# Patient Record
Sex: Female | Born: 1945 | Race: White | Hispanic: No | Marital: Single | Smoking: Never smoker
Health system: Southern US, Community
[De-identification: ages and names within clinical notes are randomized; demographics above are authoritative.]

## PROBLEM LIST (undated history)

## (undated) DIAGNOSIS — Z9889 Other specified postprocedural states: Secondary | ICD-10-CM

## (undated) DIAGNOSIS — F329 Major depressive disorder, single episode, unspecified: Secondary | ICD-10-CM

## (undated) DIAGNOSIS — F32A Depression, unspecified: Secondary | ICD-10-CM

## (undated) DIAGNOSIS — R112 Nausea with vomiting, unspecified: Secondary | ICD-10-CM

## (undated) DIAGNOSIS — F0390 Unspecified dementia without behavioral disturbance: Secondary | ICD-10-CM

## (undated) DIAGNOSIS — E119 Type 2 diabetes mellitus without complications: Secondary | ICD-10-CM

## (undated) DIAGNOSIS — E785 Hyperlipidemia, unspecified: Secondary | ICD-10-CM

## (undated) DIAGNOSIS — F419 Anxiety disorder, unspecified: Secondary | ICD-10-CM

## (undated) DIAGNOSIS — I10 Essential (primary) hypertension: Secondary | ICD-10-CM

## (undated) DIAGNOSIS — C801 Malignant (primary) neoplasm, unspecified: Secondary | ICD-10-CM

## (undated) DIAGNOSIS — G4730 Sleep apnea, unspecified: Secondary | ICD-10-CM

## (undated) DIAGNOSIS — M1990 Unspecified osteoarthritis, unspecified site: Secondary | ICD-10-CM

## (undated) HISTORY — PX: BREAST LUMPECTOMY: SHX2

## (undated) HISTORY — PX: CERVICAL POLYPECTOMY: SHX88

## (undated) HISTORY — PX: BREAST SURGERY: SHX581

## (undated) HISTORY — PX: APPENDECTOMY: SHX54

## (undated) HISTORY — PX: HERNIA REPAIR: SHX51

---

## 1983-11-05 HISTORY — PX: LUMBAR LAMINECTOMY: SHX95

## 2012-08-21 ENCOUNTER — Other Ambulatory Visit: Payer: Self-pay | Admitting: Orthopedic Surgery

## 2012-08-21 ENCOUNTER — Encounter (HOSPITAL_BASED_OUTPATIENT_CLINIC_OR_DEPARTMENT_OTHER): Payer: Self-pay | Admitting: *Deleted

## 2012-08-21 NOTE — Progress Notes (Signed)
[facility]-sees np allen claggett
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
## 2012-08-25 ENCOUNTER — Encounter (HOSPITAL_BASED_OUTPATIENT_CLINIC_OR_DEPARTMENT_OTHER): Payer: Self-pay | Admitting: Orthopedic Surgery

## 2012-08-25 ENCOUNTER — Ambulatory Visit (HOSPITAL_BASED_OUTPATIENT_CLINIC_OR_DEPARTMENT_OTHER): Payer: BC Managed Care – PPO | Admitting: Anesthesiology

## 2012-08-25 ENCOUNTER — Encounter (HOSPITAL_BASED_OUTPATIENT_CLINIC_OR_DEPARTMENT_OTHER)
Admission: RE | Disposition: A | Discharge status: Home / Self Care | Payer: Self-pay | Source: Ambulatory Visit | Attending: Orthopedic Surgery

## 2012-08-25 ENCOUNTER — Ambulatory Visit (HOSPITAL_BASED_OUTPATIENT_CLINIC_OR_DEPARTMENT_OTHER)
Admission: RE | Admit: 2012-08-25 | Discharge: 2012-08-25 | Disposition: A | Discharge status: Home / Self Care | Payer: BC Managed Care – PPO | Source: Ambulatory Visit | Attending: Orthopedic Surgery | Admitting: Orthopedic Surgery

## 2012-08-25 ENCOUNTER — Encounter (HOSPITAL_BASED_OUTPATIENT_CLINIC_OR_DEPARTMENT_OTHER): Payer: Self-pay | Admitting: Anesthesiology

## 2012-08-25 DIAGNOSIS — IMO0002 Reserved for concepts with insufficient information to code with codable children: Secondary | ICD-10-CM | POA: Insufficient documentation

## 2012-08-25 DIAGNOSIS — S65509A Unspecified injury of blood vessel of unspecified finger, initial encounter: Secondary | ICD-10-CM | POA: Insufficient documentation

## 2012-08-25 DIAGNOSIS — E119 Type 2 diabetes mellitus without complications: Secondary | ICD-10-CM | POA: Insufficient documentation

## 2012-08-25 DIAGNOSIS — W320XXA Accidental handgun discharge, initial encounter: Secondary | ICD-10-CM | POA: Insufficient documentation

## 2012-08-25 DIAGNOSIS — I10 Essential (primary) hypertension: Secondary | ICD-10-CM | POA: Insufficient documentation

## 2012-08-25 HISTORY — DX: Malignant (primary) neoplasm, unspecified: C80.1

## 2012-08-25 HISTORY — DX: Major depressive disorder, single episode, unspecified: F32.9

## 2012-08-25 HISTORY — DX: Unspecified osteoarthritis, unspecified site: M19.90

## 2012-08-25 HISTORY — PX: ARTERY EXPLORATION: SHX5110

## 2012-08-25 HISTORY — DX: Sleep apnea, unspecified: G47.30

## 2012-08-25 HISTORY — DX: Other specified postprocedural states: Z98.890

## 2012-08-25 HISTORY — DX: Type 2 diabetes mellitus without complications: E11.9

## 2012-08-25 HISTORY — PX: NERVE EXPLORATION: SHX2082

## 2012-08-25 HISTORY — DX: Anxiety disorder, unspecified: F41.9

## 2012-08-25 HISTORY — DX: Nausea with vomiting, unspecified: R11.2

## 2012-08-25 HISTORY — DX: Essential (primary) hypertension: I10

## 2012-08-25 HISTORY — DX: Hyperlipidemia, unspecified: E78.5

## 2012-08-25 HISTORY — DX: Depression, unspecified: F32.A

## 2012-08-25 LAB — POCT I-STAT, CHEM 8
BUN: 17 mg/dL (ref 6–23)
Calcium, Ion: 1.17 mmol/L (ref 1.13–1.30)
Hemoglobin: 13.6 g/dL (ref 12.0–15.0)
TCO2: 22 mmol/L (ref 0–100)

## 2012-08-25 LAB — GLUCOSE, CAPILLARY: Glucose-Capillary: 145 mg/dL — ABNORMAL HIGH (ref 70–99)

## 2012-08-25 SURGERY — EXPLORATION, ARTERY
Anesthesia: General | Site: Finger | Laterality: Right | Wound class: Clean

## 2012-08-25 MED ORDER — HYDROMORPHONE HCL PF 1 MG/ML IJ SOLN: 0.2500 mg | INTRAMUSCULAR | Status: DC | PRN

## 2012-08-25 MED ORDER — BUPIVACAINE-EPINEPHRINE PF 0.5-1:200000 % IJ SOLN
INTRAMUSCULAR | Status: DC | PRN
  Administered 2012-08-25: 27 mL

## 2012-08-25 MED ORDER — LACTATED RINGERS IV SOLN
INTRAVENOUS | Status: DC
  Administered 2012-08-25 (×2): via INTRAVENOUS

## 2012-08-25 MED ORDER — ONDANSETRON HCL 4 MG/2ML IJ SOLN: 4.0000 mg | Freq: Once | INTRAMUSCULAR | Status: DC | PRN

## 2012-08-25 MED ORDER — LIDOCAINE HCL (CARDIAC) 20 MG/ML IV SOLN
INTRAVENOUS | Status: DC | PRN
  Administered 2012-08-25: 60 mg via INTRAVENOUS

## 2012-08-25 MED ORDER — SCOPOLAMINE 1 MG/3DAYS TD PT72
MEDICATED_PATCH | TRANSDERMAL | Status: DC | PRN
  Administered 2012-08-25: 1.5 mg via TRANSDERMAL

## 2012-08-25 MED ORDER — CHLORHEXIDINE GLUCONATE 4 % EX LIQD: 60.0000 mL | Freq: Once | CUTANEOUS | Status: DC

## 2012-08-25 MED ORDER — CEPHALEXIN 250 MG PO CAPS: 250.0000 mg | ORAL_CAPSULE | Freq: Four times a day (QID) | ORAL | Status: DC

## 2012-08-25 MED ORDER — OXYCODONE HCL 5 MG PO TABS: 5.0000 mg | ORAL_TABLET | Freq: Once | ORAL | Status: DC | PRN

## 2012-08-25 MED ORDER — CEFAZOLIN SODIUM-DEXTROSE 2-3 GM-% IV SOLR: 2.0000 g | INTRAVENOUS | Status: DC

## 2012-08-25 MED ORDER — FENTANYL CITRATE 0.05 MG/ML IJ SOLN
50.0000 ug | INTRAMUSCULAR | Status: DC | PRN
  Administered 2012-08-25: 100 ug via INTRAVENOUS

## 2012-08-25 MED ORDER — OXYCODONE HCL 5 MG/5ML PO SOLN: 5.0000 mg | Freq: Once | ORAL | Status: DC | PRN

## 2012-08-25 MED ORDER — OXYCODONE-ACETAMINOPHEN 7.5-500 MG PO TABS: 1.0000 | ORAL_TABLET | ORAL | Status: DC | PRN

## 2012-08-25 MED ORDER — ONDANSETRON HCL 4 MG/2ML IJ SOLN
INTRAMUSCULAR | Status: DC | PRN
  Administered 2012-08-25: 4 mg via INTRAVENOUS

## 2012-08-25 MED ORDER — LIDOCAINE HCL (PF) 1 % IJ SOLN
INTRAVENOUS | Status: DC | PRN
  Administered 2012-08-25: 14:00:00

## 2012-08-25 MED ORDER — DEXAMETHASONE SODIUM PHOSPHATE 10 MG/ML IJ SOLN
INTRAMUSCULAR | Status: DC | PRN
  Administered 2012-08-25: 5 mg via INTRAVENOUS

## 2012-08-25 MED ORDER — PROPOFOL 10 MG/ML IV BOLUS
INTRAVENOUS | Status: DC | PRN
  Administered 2012-08-25: 200 mg via INTRAVENOUS

## 2012-08-25 MED ORDER — DEXTROSE 5 % IV SOLN
3.0000 g | INTRAVENOUS | Status: AC
  Administered 2012-08-25: 3 g via INTRAVENOUS

## 2012-08-25 MED ORDER — MIDAZOLAM HCL 2 MG/2ML IJ SOLN
1.0000 mg | INTRAMUSCULAR | Status: DC | PRN
  Administered 2012-08-25: 2 mg via INTRAVENOUS

## 2012-08-25 SURGICAL SUPPLY — 61 items
3.0MM X 60MM CARBON FIBER ROD ×2 IMPLANT
BAG DECANTER FOR FLEXI CONT (MISCELLANEOUS) ×2 IMPLANT
BANDAGE GAUZE ELAST BULKY 4 IN (GAUZE/BANDAGES/DRESSINGS) ×2 IMPLANT
BLADE MINI RND TIP GREEN BEAV (BLADE) IMPLANT
BLADE SURG 15 STRL LF DISP TIS (BLADE) ×1 IMPLANT
BLADE SURG 15 STRL SS (BLADE) ×1
BNDG COHESIVE 3X5 TAN STRL LF (GAUZE/BANDAGES/DRESSINGS) ×2 IMPLANT
BNDG ESMARK 4X9 LF (GAUZE/BANDAGES/DRESSINGS) IMPLANT
CHLORAPREP W/TINT 26ML (MISCELLANEOUS) ×2 IMPLANT
CLOTH BEACON ORANGE TIMEOUT ST (SAFETY) ×2 IMPLANT
CORDS BIPOLAR (ELECTRODE) ×2 IMPLANT
COVER MAYO STAND STRL (DRAPES) ×2 IMPLANT
COVER TABLE BACK 60X90 (DRAPES) ×2 IMPLANT
CUFF TOURNIQUET SINGLE 18IN (TOURNIQUET CUFF) ×2 IMPLANT
DECANTER SPIKE VIAL GLASS SM (MISCELLANEOUS) IMPLANT
DRAPE EXTREMITY T 121X128X90 (DRAPE) ×2 IMPLANT
DRAPE SURG 17X23 STRL (DRAPES) ×2 IMPLANT
DRSG KUZMA FLUFF (GAUZE/BANDAGES/DRESSINGS) IMPLANT
GAUZE XEROFORM 1X8 LF (GAUZE/BANDAGES/DRESSINGS) ×2 IMPLANT
GLOVE BIO SURGEON STRL SZ 6.5 (GLOVE) ×2 IMPLANT
GLOVE BIO SURGEON STRL SZ7.5 (GLOVE) ×2 IMPLANT
GLOVE BIOGEL PI IND STRL 7.0 (GLOVE) ×1 IMPLANT
GLOVE BIOGEL PI IND STRL 8 (GLOVE) ×1 IMPLANT
GLOVE BIOGEL PI INDICATOR 7.0 (GLOVE) ×1
GLOVE BIOGEL PI INDICATOR 8 (GLOVE) ×1
GLOVE SURG ORTHO 8.0 STRL STRW (GLOVE) ×2 IMPLANT
GOWN BRE IMP PREV XXLGXLNG (GOWN DISPOSABLE) ×4 IMPLANT
GOWN PREVENTION PLUS XLARGE (GOWN DISPOSABLE) ×2 IMPLANT
GUIDEWIRE 1.25MM ×4 IMPLANT
GUIDEWIRE 1.6MM ×4 IMPLANT
HOLDING CLAMPS ×4 IMPLANT
LOOP VESSEL MAXI BLUE (MISCELLANEOUS) IMPLANT
NDL SAFETY ECLIPSE 18X1.5 (NEEDLE) ×2 IMPLANT
NEEDLE 27GAX1X1/2 (NEEDLE) IMPLANT
NEEDLE HYPO 18GX1.5 SHARP (NEEDLE) ×2
NS IRRIG 1000ML POUR BTL (IV SOLUTION) ×2 IMPLANT
PACK BASIN DAY SURGERY FS (CUSTOM PROCEDURE TRAY) ×2 IMPLANT
PAD CAST 3X4 CTTN HI CHSV (CAST SUPPLIES) ×1 IMPLANT
PAD CAST 4YDX4 CTTN HI CHSV (CAST SUPPLIES) IMPLANT
PADDING CAST ABS 4INX4YD NS (CAST SUPPLIES) ×1
PADDING CAST ABS COTTON 4X4 ST (CAST SUPPLIES) ×1 IMPLANT
PADDING CAST COTTON 3X4 STRL (CAST SUPPLIES) ×1
PADDING CAST COTTON 4X4 STRL (CAST SUPPLIES)
SLEEVE SCD COMPRESS KNEE MED (MISCELLANEOUS) ×2 IMPLANT
SPEAR EYE SURG WECK-CEL (MISCELLANEOUS) ×2 IMPLANT
SPLINT PLASTER CAST XFAST 3X15 (CAST SUPPLIES) IMPLANT
SPLINT PLASTER XTRA FASTSET 3X (CAST SUPPLIES)
SPONGE GAUZE 4X4 12PLY (GAUZE/BANDAGES/DRESSINGS) ×2 IMPLANT
STOCKINETTE 4X48 STRL (DRAPES) ×2 IMPLANT
SUT ETHIBOND 3-0 V-5 (SUTURE) IMPLANT
SUT ETHILON 9 0 V 100.4 (SUTURE) ×2 IMPLANT
SUT MERSILENE 6 0 P 1 (SUTURE) IMPLANT
SUT NYLON 9 0 VRM6 (SUTURE) ×2 IMPLANT
SUT SILK 4 0 PS 2 (SUTURE) IMPLANT
SUT VICRYL 4-0 PS2 18IN ABS (SUTURE) IMPLANT
SUT VICRYL RAPIDE 4/0 PS 2 (SUTURE) ×2 IMPLANT
SYR BULB 3OZ (MISCELLANEOUS) ×2 IMPLANT
SYR CONTROL 10ML LL (SYRINGE) ×4 IMPLANT
TOWEL OR 17X24 6PK STRL BLUE (TOWEL DISPOSABLE) ×2 IMPLANT
UNDERPAD 30X30 INCONTINENT (UNDERPADS AND DIAPERS) ×2 IMPLANT
WATER STERILE IRR 1000ML POUR (IV SOLUTION) IMPLANT

## 2012-08-25 NOTE — Op Note (Signed)
Dictated [id number]

## 2012-08-25 NOTE — Anesthesia Preprocedure Evaluation (Addendum)
Anesthesia Evaluation  Patient identified by MRN, date of birth, ID band Patient awake    Reviewed: Allergy & Precautions, H&P , NPO status , Patient's Chart, lab work & pertinent test results  History of Anesthesia Complications (+) PONV  Airway Mallampati: I TM Distance: >3 FB     Dental  (+) Teeth Intact and Dental Advisory Given   Pulmonary sleep apnea and Continuous Positive Airway Pressure Ventilation ,  breath sounds clear to auscultation        Cardiovascular hypertension, Pt. on medications Rhythm:Regular Rate:Normal     Neuro/Psych    GI/Hepatic   Endo/Other  diabetes, Well Controlled, Type 2, Oral Hypoglycemic AgentsMorbid obesity  Renal/GU      Musculoskeletal   Abdominal (+) + obese,   Peds  Hematology   Anesthesia Other Findings   Reproductive/Obstetrics                          Anesthesia Physical Anesthesia Plan  ASA: III  Anesthesia Plan: General   Post-op Pain Management:    Induction: Intravenous  Airway Management Planned: LMA  Additional Equipment:   Intra-op Plan:   Post-operative Plan: Extubation in OR  Informed Consent: I have reviewed the patients History and Physical, chart, labs and discussed the procedure including the risks, benefits and alternatives for the proposed anesthesia with the patient or authorized representative who has indicated his/her understanding and acceptance.   Dental advisory given  Plan Discussed with: CRNA, Anesthesiologist and Surgeon  Anesthesia Plan Comments:         Anesthesia Quick Evaluation
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
## 2012-08-25 NOTE — Anesthesia Postprocedure Evaluation (Signed)
  Anesthesia Post-op Note  Patient: Brandi [name]  Procedure(s) Performed: Procedure(s) (LRB) with comments: ARTERY EXPLORATION (Right) - Application External Fixator Exploration Digital Artery Right Little Finger/Exploration Artery and Nerve Right Ring Finger NERVE EXPLORATION (Right)  Patient Location: PACU  Anesthesia Type: GA combined with regional for post-op pain  Level of Consciousness: awake, alert  and oriented  Airway and Oxygen Therapy: Patient Spontanous Breathing and Patient connected to face mask oxygen  Post-op Pain: none  Post-op Assessment: Post-op Vital signs reviewed  Post-op Vital Signs: Reviewed  Complications: No apparent anesthesia complications

## 2012-08-25 NOTE — Transfer of Care (Signed)
Immediate Anesthesia Transfer of Care Note  Patient: Brandi [name]  Procedure(s) Performed: Procedure(s) (LRB) with comments: ARTERY EXPLORATION (Right) - Application External Fixator Exploration Digital Artery Right Little Finger/Exploration Artery and Nerve Right Ring Finger NERVE EXPLORATION (Right)  Patient Location: PACU  Anesthesia Type: GA combined with regional for post-op pain  Level of Consciousness: sedated  Airway & Oxygen Therapy: Patient Spontanous Breathing and Patient connected to face mask oxygen  Post-op Assessment: Report given to PACU RN and Post -op Vital signs reviewed and stable  Post vital signs: Reviewed and stable  Complications: No apparent anesthesia complications

## 2012-08-25 NOTE — Progress Notes (Signed)
Assisted Dr. Crews with right, ultrasound guided, supraclavicular block. Side rails up, monitors on throughout procedure. See vital signs in flow sheet. Tolerated Procedure well. 
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
## 2012-08-31 ENCOUNTER — Encounter (HOSPITAL_BASED_OUTPATIENT_CLINIC_OR_DEPARTMENT_OTHER): Payer: Self-pay

## 2012-09-03 ENCOUNTER — Encounter (HOSPITAL_BASED_OUTPATIENT_CLINIC_OR_DEPARTMENT_OTHER): Payer: Self-pay | Admitting: Orthopedic Surgery

## 2012-09-29 ENCOUNTER — Other Ambulatory Visit: Payer: Self-pay | Admitting: Orthopedic Surgery

## 2012-10-06 ENCOUNTER — Encounter (HOSPITAL_BASED_OUTPATIENT_CLINIC_OR_DEPARTMENT_OTHER): Payer: Self-pay | Admitting: *Deleted

## 2012-10-06 NOTE — Progress Notes (Signed)
Pt here 1//13 for this finger-no chg since last visit

## 2012-10-07 ENCOUNTER — Encounter (HOSPITAL_BASED_OUTPATIENT_CLINIC_OR_DEPARTMENT_OTHER): Payer: Self-pay

## 2012-10-07 ENCOUNTER — Ambulatory Visit (HOSPITAL_BASED_OUTPATIENT_CLINIC_OR_DEPARTMENT_OTHER): Payer: BC Managed Care – PPO | Admitting: Anesthesiology

## 2012-10-07 ENCOUNTER — Encounter (HOSPITAL_BASED_OUTPATIENT_CLINIC_OR_DEPARTMENT_OTHER)
Admission: RE | Disposition: A | Discharge status: Home / Self Care | Payer: Self-pay | Source: Ambulatory Visit | Attending: Orthopedic Surgery

## 2012-10-07 ENCOUNTER — Encounter (HOSPITAL_BASED_OUTPATIENT_CLINIC_OR_DEPARTMENT_OTHER): Payer: Self-pay | Admitting: Anesthesiology

## 2012-10-07 ENCOUNTER — Ambulatory Visit (HOSPITAL_BASED_OUTPATIENT_CLINIC_OR_DEPARTMENT_OTHER)
Admission: RE | Admit: 2012-10-07 | Discharge: 2012-10-07 | Disposition: A | Discharge status: Home / Self Care | Payer: BC Managed Care – PPO | Source: Ambulatory Visit | Attending: Orthopedic Surgery | Admitting: Orthopedic Surgery

## 2012-10-07 DIAGNOSIS — Z853 Personal history of malignant neoplasm of breast: Secondary | ICD-10-CM | POA: Insufficient documentation

## 2012-10-07 DIAGNOSIS — Z4789 Encounter for other orthopedic aftercare: Secondary | ICD-10-CM | POA: Insufficient documentation

## 2012-10-07 DIAGNOSIS — E119 Type 2 diabetes mellitus without complications: Secondary | ICD-10-CM | POA: Insufficient documentation

## 2012-10-07 DIAGNOSIS — IMO0002 Reserved for concepts with insufficient information to code with codable children: Secondary | ICD-10-CM | POA: Insufficient documentation

## 2012-10-07 DIAGNOSIS — W320XXA Accidental handgun discharge, initial encounter: Secondary | ICD-10-CM | POA: Insufficient documentation

## 2012-10-07 DIAGNOSIS — I10 Essential (primary) hypertension: Secondary | ICD-10-CM | POA: Insufficient documentation

## 2012-10-07 HISTORY — PX: PROXIMAL INTERPHALANGEAL FUSION (PIP): SHX6043

## 2012-10-07 HISTORY — PX: EXTERNAL FIXATION REMOVAL: SHX5040

## 2012-10-07 LAB — POCT I-STAT, CHEM 8
BUN: 19 mg/dL (ref 6–23)
Calcium, Ion: 1.1 mmol/L — ABNORMAL LOW (ref 1.13–1.30)
Glucose, Bld: 127 mg/dL — ABNORMAL HIGH (ref 70–99)
TCO2: 26 mmol/L (ref 0–100)

## 2012-10-07 SURGERY — FUSION, PIP JOINT
Anesthesia: Regional | Site: Hand | Laterality: Right | Wound class: Clean

## 2012-10-07 MED ORDER — DEXAMETHASONE SODIUM PHOSPHATE 10 MG/ML IJ SOLN
INTRAMUSCULAR | Status: DC | PRN
  Administered 2012-10-07: 4 mg via INTRAVENOUS

## 2012-10-07 MED ORDER — CHLORHEXIDINE GLUCONATE 4 % EX LIQD: 60.0000 mL | Freq: Once | CUTANEOUS | Status: DC

## 2012-10-07 MED ORDER — FENTANYL CITRATE 0.05 MG/ML IJ SOLN
100.0000 ug | Freq: Once | INTRAMUSCULAR | Status: AC
  Administered 2012-10-07: 100 ug via INTRAVENOUS

## 2012-10-07 MED ORDER — ONDANSETRON HCL 4 MG/2ML IJ SOLN
INTRAMUSCULAR | Status: DC | PRN
  Administered 2012-10-07: 4 mg via INTRAVENOUS

## 2012-10-07 MED ORDER — OXYCODONE HCL 5 MG/5ML PO SOLN: 5.0000 mg | Freq: Once | ORAL | Status: DC | PRN

## 2012-10-07 MED ORDER — OXYCODONE-ACETAMINOPHEN 7.5-325 MG PO TABS: 1.0000 | ORAL_TABLET | ORAL | Status: DC | PRN

## 2012-10-07 MED ORDER — HYDROMORPHONE HCL PF 1 MG/ML IJ SOLN: 0.2500 mg | INTRAMUSCULAR | Status: DC | PRN

## 2012-10-07 MED ORDER — PROPOFOL 10 MG/ML IV BOLUS
INTRAVENOUS | Status: DC | PRN
  Administered 2012-10-07: 200 mg via INTRAVENOUS

## 2012-10-07 MED ORDER — FENTANYL CITRATE 0.05 MG/ML IJ SOLN
INTRAMUSCULAR | Status: DC | PRN
  Administered 2012-10-07: 50 ug via INTRAVENOUS

## 2012-10-07 MED ORDER — OXYCODONE HCL 5 MG PO TABS: 5.0000 mg | ORAL_TABLET | Freq: Once | ORAL | Status: DC | PRN

## 2012-10-07 MED ORDER — MIDAZOLAM HCL 2 MG/2ML IJ SOLN
1.0000 mg | INTRAMUSCULAR | Status: DC | PRN
  Administered 2012-10-07: 2 mg via INTRAVENOUS

## 2012-10-07 MED ORDER — SCOPOLAMINE 1 MG/3DAYS TD PT72
1.0000 | MEDICATED_PATCH | Freq: Once | TRANSDERMAL | Status: DC
  Administered 2012-10-07: 1.5 mg via TRANSDERMAL

## 2012-10-07 MED ORDER — ROPIVACAINE HCL 5 MG/ML IJ SOLN
INTRAMUSCULAR | Status: DC | PRN
  Administered 2012-10-07: 30 mL

## 2012-10-07 MED ORDER — CEFAZOLIN SODIUM-DEXTROSE 2-3 GM-% IV SOLR: 2.0000 g | INTRAVENOUS | Status: DC

## 2012-10-07 MED ORDER — LIDOCAINE HCL (CARDIAC) 20 MG/ML IV SOLN
INTRAVENOUS | Status: DC | PRN
  Administered 2012-10-07: 50 mg via INTRAVENOUS

## 2012-10-07 MED ORDER — CEPHALEXIN 500 MG PO CAPS: 500.0000 mg | ORAL_CAPSULE | Freq: Four times a day (QID) | ORAL | Status: DC

## 2012-10-07 MED ORDER — DEXTROSE 5 % IV SOLN
3.0000 g | INTRAVENOUS | Status: AC
  Administered 2012-10-07: 3 g via INTRAVENOUS

## 2012-10-07 MED ORDER — PROMETHAZINE HCL 25 MG/ML IJ SOLN: 6.2500 mg | INTRAMUSCULAR | Status: DC | PRN

## 2012-10-07 MED ORDER — MEPERIDINE HCL 25 MG/ML IJ SOLN: 6.2500 mg | INTRAMUSCULAR | Status: DC | PRN

## 2012-10-07 MED ORDER — LACTATED RINGERS IV SOLN: INTRAVENOUS | Status: DC

## 2012-10-07 MED ORDER — LACTATED RINGERS IV SOLN
INTRAVENOUS | Status: DC
  Administered 2012-10-07 (×2): via INTRAVENOUS

## 2012-10-07 SURGICAL SUPPLY — 60 items
BANDAGE GAUZE ELAST BULKY 4 IN (GAUZE/BANDAGES/DRESSINGS) ×3 IMPLANT
BIT DRILL 1.0 (BIT) ×3
BIT DRILL 1.0X50 (BIT) ×2 IMPLANT
BLADE MINI RND TIP GREEN BEAV (BLADE) ×3 IMPLANT
BLADE SURG 15 STRL LF DISP TIS (BLADE) ×4 IMPLANT
BLADE SURG 15 STRL SS (BLADE) ×2
BNDG COHESIVE 3X5 TAN STRL LF (GAUZE/BANDAGES/DRESSINGS) ×3 IMPLANT
BNDG ESMARK 4X9 LF (GAUZE/BANDAGES/DRESSINGS) ×3 IMPLANT
BUR FAST CUTTING MED (BURR) IMPLANT
CHLORAPREP W/TINT 26ML (MISCELLANEOUS) ×3 IMPLANT
CLOTH BEACON ORANGE TIMEOUT ST (SAFETY) ×3 IMPLANT
CORDS BIPOLAR (ELECTRODE) ×3 IMPLANT
COVER MAYO STAND STRL (DRAPES) ×3 IMPLANT
COVER TABLE BACK 60X90 (DRAPES) ×3 IMPLANT
CUFF TOURNIQUET SINGLE 18IN (TOURNIQUET CUFF) ×3 IMPLANT
DRAPE EXTREMITY T 121X128X90 (DRAPE) ×3 IMPLANT
DRAPE OEC MINIVIEW 54X84 (DRAPES) ×3 IMPLANT
DRAPE SURG 17X23 STRL (DRAPES) ×3 IMPLANT
DRSG KUZMA FLUFF (GAUZE/BANDAGES/DRESSINGS) IMPLANT
GAUZE XEROFORM 1X8 LF (GAUZE/BANDAGES/DRESSINGS) ×3 IMPLANT
GLOVE BIO SURGEON STRL SZ 6.5 (GLOVE) ×3 IMPLANT
GLOVE BIO SURGEON STRL SZ8.5 (GLOVE) ×3 IMPLANT
GLOVE BIOGEL PI IND STRL 7.0 (GLOVE) ×2 IMPLANT
GLOVE BIOGEL PI IND STRL 8 (GLOVE) ×2 IMPLANT
GLOVE BIOGEL PI IND STRL 8.5 (GLOVE) ×2 IMPLANT
GLOVE BIOGEL PI INDICATOR 7.0 (GLOVE) ×1
GLOVE BIOGEL PI INDICATOR 8 (GLOVE) ×1
GLOVE BIOGEL PI INDICATOR 8.5 (GLOVE) ×1
GLOVE SKINSENSE NS SZ7.5 (GLOVE) ×1
GLOVE SKINSENSE STRL SZ7.5 (GLOVE) ×2 IMPLANT
GLOVE SURG ORTHO 8.0 STRL STRW (GLOVE) ×3 IMPLANT
GOWN BRE IMP PREV XXLGXLNG (GOWN DISPOSABLE) ×6 IMPLANT
GOWN PREVENTION PLUS XLARGE (GOWN DISPOSABLE) ×3 IMPLANT
K-WIRE .035X4 (WIRE) ×3 IMPLANT
NS IRRIG 1000ML POUR BTL (IV SOLUTION) ×3 IMPLANT
PACK BASIN DAY SURGERY FS (CUSTOM PROCEDURE TRAY) ×3 IMPLANT
PAD CAST 3X4 CTTN HI CHSV (CAST SUPPLIES) ×2 IMPLANT
PADDING CAST ABS 3INX4YD NS (CAST SUPPLIES)
PADDING CAST ABS 4INX4YD NS (CAST SUPPLIES) ×1
PADDING CAST ABS COTTON 3X4 (CAST SUPPLIES) IMPLANT
PADDING CAST ABS COTTON 4X4 ST (CAST SUPPLIES) ×2 IMPLANT
PADDING CAST COTTON 3X4 STRL (CAST SUPPLIES) ×1
PLATE STRAIGHT 1.3 6 HOLE (Plate) ×3 IMPLANT
SCREW SELF TAP CORTEX 1.3 10MM (Screw) ×3 IMPLANT
SCREW SELF TAP CORTEX 1.3 6MM (Screw) ×6 IMPLANT
SCREW SELF TAP CORTEX 1.3 8MM (Screw) ×3 IMPLANT
SCREW SELF TAP CORTEX 1.3 9MM (Screw) ×3 IMPLANT
SLEEVE SCD COMPRESS KNEE MED (MISCELLANEOUS) ×3 IMPLANT
SPLINT PLASTER CAST XFAST 3X15 (CAST SUPPLIES) ×40 IMPLANT
SPLINT PLASTER XTRA FASTSET 3X (CAST SUPPLIES) ×20
SPONGE GAUZE 4X4 12PLY (GAUZE/BANDAGES/DRESSINGS) ×3 IMPLANT
STOCKINETTE 4X48 STRL (DRAPES) ×3 IMPLANT
SUT VICRYL 4-0 PS2 18IN ABS (SUTURE) ×6 IMPLANT
SUT VICRYL RAPID 5 0 P 3 (SUTURE) IMPLANT
SUT VICRYL RAPIDE 4/0 PS 2 (SUTURE) ×3 IMPLANT
SYR BULB 3OZ (MISCELLANEOUS) ×3 IMPLANT
SYR CONTROL 10ML LL (SYRINGE) IMPLANT
TOWEL OR 17X24 6PK STRL BLUE (TOWEL DISPOSABLE) ×3 IMPLANT
UNDERPAD 30X30 INCONTINENT (UNDERPADS AND DIAPERS) ×3 IMPLANT
WATER STERILE IRR 1000ML POUR (IV SOLUTION) ×3 IMPLANT

## 2012-10-07 NOTE — Transfer of Care (Signed)
Immediate Anesthesia Transfer of Care Note  Patient: Brandi [name]  Procedure(s) Performed: Procedure(s) (LRB) with comments: PROXIMAL INTERPHALANGEAL FUSION (PIP) (Right) - FUSION PIP RIGHT SMALL FINGER, DISTAL RADIUS GRAFT, REMOVAL OF EXTERNAL FIXATOR, SYNTHESE MINI REMOVAL EXTERNAL FIXATION ARM (Right)  Patient Location: PACU  Anesthesia Type:GA combined with regional for post-op pain  Level of Consciousness: awake, alert  and oriented  Airway & Oxygen Therapy: Patient Spontanous Breathing and Patient connected to face mask oxygen  Post-op Assessment: Report given to PACU RN, Post -op Vital signs reviewed and stable and Patient moving all extremities  Post vital signs: Reviewed and stable  Complications: No apparent anesthesia complications

## 2012-10-07 NOTE — Anesthesia Postprocedure Evaluation (Signed)
  Anesthesia Post-op Note  Patient: Brandi [name]  Procedure(s) Performed: Procedure(s) (LRB) with comments: PROXIMAL INTERPHALANGEAL FUSION (PIP) (Right) - FUSION PIP RIGHT SMALL FINGER, DISTAL RADIUS GRAFT, REMOVAL OF EXTERNAL FIXATOR, SYNTHESE MINI REMOVAL EXTERNAL FIXATION ARM (Right)  Patient Location: PACU  Anesthesia Type:GA combined with regional for post-op pain  Level of Consciousness: awake, alert  and oriented  Airway and Oxygen Therapy: Patient Spontanous Breathing and Patient connected to face mask oxygen  Post-op Pain: none  Post-op Assessment: Post-op Vital signs reviewed, Patient's Cardiovascular Status Stable, Respiratory Function Stable, Patent Airway and No signs of Nausea or vomiting  Post-op Vital Signs: Reviewed and stable  Complications: No apparent anesthesia complications
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
## 2012-10-07 NOTE — Progress Notes (Signed)
Assisted Dr. Massagee with right, ultrasound guided, supraclavicular block. Side rails up, monitors on throughout procedure. See vital signs in flow sheet. Tolerated Procedure well. 

## 2012-10-07 NOTE — Anesthesia Preprocedure Evaluation (Addendum)
Anesthesia Evaluation  Patient identified by MRN, date of birth, ID band Patient awake    Reviewed: Allergy & Precautions, H&P , NPO status , Patient's Chart, lab work & pertinent test results  Airway Mallampati: I TM Distance: >3 FB Neck ROM: full    Dental  (+) Teeth Intact and Dental Advisory Given   Pulmonary sleep apnea and Continuous Positive Airway Pressure Ventilation ,  breath sounds clear to auscultation        Cardiovascular hypertension, Pt. on medications Rhythm:Regular Rate:Normal     Neuro/Psych    GI/Hepatic   Endo/Other  diabetes, Well Controlled, Type 2, Oral Hypoglycemic AgentsMorbid obesity  Renal/GU      Musculoskeletal   Abdominal (+) + obese,   Peds  Hematology   Anesthesia Other Findings   Reproductive/Obstetrics                           Anesthesia Physical Anesthesia Plan  ASA: III  Anesthesia Plan: General LMA and Regional   Post-op Pain Management: MAC Combined w/ Regional for Post-op pain   Induction:   Airway Management Planned:   Additional Equipment:   Intra-op Plan:   Post-operative Plan:   Informed Consent:   Plan Discussed with:   Anesthesia Plan Comments:         Anesthesia Quick Evaluation

## 2012-10-07 NOTE — Op Note (Signed)
Other Dictation: Dictation Number [id number]

## 2012-10-07 NOTE — Anesthesia Procedure Notes (Addendum)
Anesthesia Regional Block:  Supraclavicular block  Pre-Anesthetic Checklist: ,, timeout performed, Correct Patient, Correct Site, Correct Laterality, Correct Procedure, Correct Position, site marked, Risks and benefits discussed,  Surgical consent,  Pre-op evaluation,  At surgeon's request and post-op pain management  Laterality: Right and Upper  Prep: chloraprep       Needles:  Injection technique: Single-shot  Needle Type: Echogenic Needle      Needle Gauge: 22 and 22 G    Additional Needles:  Procedures: ultrasound guided (picture in chart) Supraclavicular block Narrative:  Start time: 10/07/2012 12:55 PM End time: 10/07/2012 1:14 PM Injection made incrementally with aspirations every 5 mL.  Performed by: Personally  Anesthesiologist: TMassagee  Supraclavicular block Procedure Name: LMA Insertion Date/Time: 10/07/2012 1:28 PM Performed by: Meyer Russel Pre-anesthesia Checklist: Patient identified, Emergency Drugs available, Suction available and Patient being monitored Patient Re-evaluated:Patient Re-evaluated prior to inductionOxygen Delivery Method: Circle System Utilized Preoxygenation: Pre-oxygenation with 100% oxygen Intubation Type: IV induction Ventilation: Mask ventilation without difficulty LMA: LMA inserted LMA Size: 4.0 Number of attempts: 1 Airway Equipment and Method: bite block Placement Confirmation: positive ETCO2 and breath sounds checked- equal and bilateral Tube secured with: Tape Dental Injury: Teeth and Oropharynx as per pre-operative assessment
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
## 2012-10-08 ENCOUNTER — Encounter (HOSPITAL_BASED_OUTPATIENT_CLINIC_OR_DEPARTMENT_OTHER): Payer: Self-pay | Admitting: Orthopedic Surgery
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
## 2012-10-09 LAB — WOUND CULTURE

## 2012-10-12 LAB — ANAEROBIC CULTURE

## 2016-06-24 ENCOUNTER — Other Ambulatory Visit: Payer: Self-pay | Admitting: Orthopedic Surgery

## 2016-06-26 ENCOUNTER — Encounter (HOSPITAL_BASED_OUTPATIENT_CLINIC_OR_DEPARTMENT_OTHER): Payer: Self-pay | Admitting: *Deleted
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
## 2016-06-28 ENCOUNTER — Encounter (HOSPITAL_BASED_OUTPATIENT_CLINIC_OR_DEPARTMENT_OTHER)
Admission: RE | Admit: 2016-06-28 | Discharge: 2016-06-28 | Disposition: A | Discharge status: Home / Self Care | Payer: Medicare Other | Source: Ambulatory Visit | Attending: Orthopedic Surgery | Admitting: Orthopedic Surgery

## 2016-06-28 ENCOUNTER — Other Ambulatory Visit: Payer: Self-pay

## 2016-06-28 DIAGNOSIS — Z7984 Long term (current) use of oral hypoglycemic drugs: Secondary | ICD-10-CM | POA: Diagnosis not present

## 2016-06-28 DIAGNOSIS — E785 Hyperlipidemia, unspecified: Secondary | ICD-10-CM | POA: Diagnosis not present

## 2016-06-28 DIAGNOSIS — I10 Essential (primary) hypertension: Secondary | ICD-10-CM | POA: Diagnosis not present

## 2016-06-28 DIAGNOSIS — F329 Major depressive disorder, single episode, unspecified: Secondary | ICD-10-CM | POA: Diagnosis not present

## 2016-06-28 DIAGNOSIS — S42292A Other displaced fracture of upper end of left humerus, initial encounter for closed fracture: Secondary | ICD-10-CM | POA: Diagnosis not present

## 2016-06-28 DIAGNOSIS — E119 Type 2 diabetes mellitus without complications: Secondary | ICD-10-CM | POA: Diagnosis not present

## 2016-06-28 DIAGNOSIS — Z6841 Body Mass Index (BMI) 40.0 and over, adult: Secondary | ICD-10-CM | POA: Diagnosis not present

## 2016-06-28 DIAGNOSIS — G4730 Sleep apnea, unspecified: Secondary | ICD-10-CM | POA: Diagnosis not present

## 2016-06-28 DIAGNOSIS — Z853 Personal history of malignant neoplasm of breast: Secondary | ICD-10-CM | POA: Diagnosis not present

## 2016-06-28 DIAGNOSIS — Z79899 Other long term (current) drug therapy: Secondary | ICD-10-CM | POA: Diagnosis not present

## 2016-06-28 DIAGNOSIS — W1830XA Fall on same level, unspecified, initial encounter: Secondary | ICD-10-CM | POA: Diagnosis not present

## 2016-06-28 LAB — BASIC METABOLIC PANEL
Anion gap: 12 (ref 5–15)
BUN: 17 mg/dL (ref 6–20)
CHLORIDE: 99 mmol/L — AB (ref 101–111)
CO2: 24 mmol/L (ref 22–32)
CREATININE: 1.04 mg/dL — AB (ref 0.44–1.00)
Calcium: 9.3 mg/dL (ref 8.9–10.3)
GFR calc Af Amer: >60 mL/min (ref >60)
GFR, EST NON AFRICAN AMERICAN: 54 mL/min — AB (ref >60)
GLUCOSE: 215 mg/dL — AB (ref 65–99)
POTASSIUM: 4.2 mmol/L (ref 3.5–5.1)
SODIUM: 135 mmol/L (ref 135–145)

## 2016-06-28 NOTE — Progress Notes (Signed)
Dr. Ola Spurr examined pt's airway ( Anesthesia Consult) - [place] for surgery. Dr. Ola Spurr reviewed Ekg - ok for surgery

## 2016-07-01 ENCOUNTER — Ambulatory Visit (HOSPITAL_BASED_OUTPATIENT_CLINIC_OR_DEPARTMENT_OTHER)
Admission: RE | Admit: 2016-07-01 | Discharge: 2016-07-01 | Disposition: A | Discharge status: Home / Self Care | Payer: Medicare Other | Source: Ambulatory Visit | Attending: Orthopedic Surgery | Admitting: Orthopedic Surgery

## 2016-07-01 ENCOUNTER — Ambulatory Visit (HOSPITAL_BASED_OUTPATIENT_CLINIC_OR_DEPARTMENT_OTHER): Payer: Medicare Other | Admitting: Anesthesiology

## 2016-07-01 ENCOUNTER — Ambulatory Visit (HOSPITAL_COMMUNITY): Payer: Medicare Other

## 2016-07-01 ENCOUNTER — Encounter (HOSPITAL_BASED_OUTPATIENT_CLINIC_OR_DEPARTMENT_OTHER)
Admission: RE | Disposition: A | Discharge status: Home / Self Care | Payer: Self-pay | Source: Ambulatory Visit | Attending: Orthopedic Surgery

## 2016-07-01 ENCOUNTER — Encounter (HOSPITAL_BASED_OUTPATIENT_CLINIC_OR_DEPARTMENT_OTHER): Payer: Self-pay

## 2016-07-01 DIAGNOSIS — I10 Essential (primary) hypertension: Secondary | ICD-10-CM | POA: Insufficient documentation

## 2016-07-01 DIAGNOSIS — G4730 Sleep apnea, unspecified: Secondary | ICD-10-CM | POA: Insufficient documentation

## 2016-07-01 DIAGNOSIS — E119 Type 2 diabetes mellitus without complications: Secondary | ICD-10-CM | POA: Diagnosis not present

## 2016-07-01 DIAGNOSIS — Z853 Personal history of malignant neoplasm of breast: Secondary | ICD-10-CM | POA: Diagnosis not present

## 2016-07-01 DIAGNOSIS — S42292A Other displaced fracture of upper end of left humerus, initial encounter for closed fracture: Secondary | ICD-10-CM | POA: Diagnosis not present

## 2016-07-01 DIAGNOSIS — F329 Major depressive disorder, single episode, unspecified: Secondary | ICD-10-CM | POA: Insufficient documentation

## 2016-07-01 DIAGNOSIS — Z6841 Body Mass Index (BMI) 40.0 and over, adult: Secondary | ICD-10-CM | POA: Insufficient documentation

## 2016-07-01 DIAGNOSIS — W1830XA Fall on same level, unspecified, initial encounter: Secondary | ICD-10-CM | POA: Insufficient documentation

## 2016-07-01 DIAGNOSIS — E785 Hyperlipidemia, unspecified: Secondary | ICD-10-CM | POA: Insufficient documentation

## 2016-07-01 DIAGNOSIS — Z79899 Other long term (current) drug therapy: Secondary | ICD-10-CM | POA: Insufficient documentation

## 2016-07-01 DIAGNOSIS — Z4789 Encounter for other orthopedic aftercare: Secondary | ICD-10-CM

## 2016-07-01 DIAGNOSIS — Z7984 Long term (current) use of oral hypoglycemic drugs: Secondary | ICD-10-CM | POA: Insufficient documentation

## 2016-07-01 HISTORY — PX: ORIF HUMERUS FRACTURE: SHX2126

## 2016-07-01 LAB — GLUCOSE, CAPILLARY
GLUCOSE-CAPILLARY: 210 mg/dL — AB (ref 65–99)
Glucose-Capillary: 108 mg/dL — ABNORMAL HIGH (ref 65–99)

## 2016-07-01 SURGERY — OPEN REDUCTION INTERNAL FIXATION (ORIF) PROXIMAL HUMERUS FRACTURE
Anesthesia: General | Site: Arm Upper | Laterality: Left

## 2016-07-01 MED ORDER — FENTANYL CITRATE (PF) 100 MCG/2ML IJ SOLN
INTRAMUSCULAR | Status: DC | PRN
  Administered 2016-07-01 (×2): 50 ug via INTRAVENOUS

## 2016-07-01 MED ORDER — FENTANYL CITRATE (PF) 100 MCG/2ML IJ SOLN
INTRAMUSCULAR | Status: AC
  Filled 2016-07-01: qty 2

## 2016-07-01 MED ORDER — MIDAZOLAM HCL 2 MG/2ML IJ SOLN
1.0000 mg | INTRAMUSCULAR | Status: DC | PRN
  Administered 2016-07-01: 2 mg via INTRAVENOUS

## 2016-07-01 MED ORDER — GLYCOPYRROLATE 0.2 MG/ML IJ SOLN: 0.2000 mg | Freq: Once | INTRAMUSCULAR | Status: DC | PRN

## 2016-07-01 MED ORDER — LIDOCAINE HCL (CARDIAC) 20 MG/ML IV SOLN
INTRAVENOUS | Status: DC | PRN
  Administered 2016-07-01: 50 mg via INTRAVENOUS
  Administered 2016-07-01: 60 mg via INTRAVENOUS

## 2016-07-01 MED ORDER — FENTANYL CITRATE (PF) 100 MCG/2ML IJ SOLN
50.0000 ug | INTRAMUSCULAR | Status: DC | PRN
  Administered 2016-07-01: 100 ug via INTRAVENOUS

## 2016-07-01 MED ORDER — CEFAZOLIN SODIUM-DEXTROSE 2-4 GM/100ML-% IV SOLN
2.0000 g | INTRAVENOUS | Status: AC
  Administered 2016-07-01: 3 g via INTRAVENOUS

## 2016-07-01 MED ORDER — LACTATED RINGERS IV SOLN
INTRAVENOUS | Status: DC
  Administered 2016-07-01: 09:00:00 via INTRAVENOUS

## 2016-07-01 MED ORDER — SUCCINYLCHOLINE CHLORIDE 20 MG/ML IJ SOLN
INTRAMUSCULAR | Status: DC | PRN
  Administered 2016-07-01: 100 mg via INTRAVENOUS

## 2016-07-01 MED ORDER — OXYCODONE-ACETAMINOPHEN 5-325 MG PO TABS: 1.0000 | ORAL_TABLET | Freq: Four times a day (QID) | ORAL | 0 refills | Status: DC | PRN

## 2016-07-01 MED ORDER — PROPOFOL 10 MG/ML IV BOLUS
INTRAVENOUS | Status: AC
  Filled 2016-07-01: qty 20

## 2016-07-01 MED ORDER — CEFAZOLIN SODIUM-DEXTROSE 2-4 GM/100ML-% IV SOLN
INTRAVENOUS | Status: AC
  Filled 2016-07-01: qty 100

## 2016-07-01 MED ORDER — PHENYLEPHRINE HCL 10 MG/ML IJ SOLN
INTRAMUSCULAR | Status: DC | PRN
  Administered 2016-07-01 (×3): 40 ug via INTRAVENOUS
  Administered 2016-07-01: 80 ug via INTRAVENOUS
  Administered 2016-07-01 (×2): 40 ug via INTRAVENOUS

## 2016-07-01 MED ORDER — MIDAZOLAM HCL 2 MG/2ML IJ SOLN
INTRAMUSCULAR | Status: AC
  Filled 2016-07-01: qty 2

## 2016-07-01 MED ORDER — DEXAMETHASONE SODIUM PHOSPHATE 4 MG/ML IJ SOLN
INTRAMUSCULAR | Status: DC | PRN
  Administered 2016-07-01: 5 mg via INTRAVENOUS

## 2016-07-01 MED ORDER — GLYCOPYRROLATE 0.2 MG/ML IV SOSY
PREFILLED_SYRINGE | INTRAVENOUS | Status: AC
  Filled 2016-07-01: qty 3

## 2016-07-01 MED ORDER — ONDANSETRON HCL 4 MG/2ML IJ SOLN
INTRAMUSCULAR | Status: DC | PRN
  Administered 2016-07-01: 4 mg via INTRAVENOUS

## 2016-07-01 MED ORDER — SCOPOLAMINE 1 MG/3DAYS TD PT72
MEDICATED_PATCH | TRANSDERMAL | Status: DC | PRN
  Administered 2016-07-01: 1 via TRANSDERMAL

## 2016-07-01 MED ORDER — LIDOCAINE 2% (20 MG/ML) 5 ML SYRINGE
INTRAMUSCULAR | Status: AC
  Filled 2016-07-01: qty 5

## 2016-07-01 MED ORDER — DEXAMETHASONE SODIUM PHOSPHATE 10 MG/ML IJ SOLN
INTRAMUSCULAR | Status: AC
  Filled 2016-07-01: qty 1

## 2016-07-01 MED ORDER — CEFAZOLIN IN D5W 1 GM/50ML IV SOLN
INTRAVENOUS | Status: AC
  Filled 2016-07-01: qty 50

## 2016-07-01 MED ORDER — SCOPOLAMINE 1 MG/3DAYS TD PT72: 1.0000 | MEDICATED_PATCH | Freq: Once | TRANSDERMAL | Status: DC | PRN

## 2016-07-01 MED ORDER — PHENYLEPHRINE 40 MCG/ML (10ML) SYRINGE FOR IV PUSH (FOR BLOOD PRESSURE SUPPORT)
PREFILLED_SYRINGE | INTRAVENOUS | Status: AC
  Filled 2016-07-01: qty 10

## 2016-07-01 MED ORDER — LACTATED RINGERS IV SOLN
INTRAVENOUS | Status: DC
  Administered 2016-07-01 (×3): via INTRAVENOUS

## 2016-07-01 MED ORDER — BUPIVACAINE-EPINEPHRINE (PF) 0.5% -1:200000 IJ SOLN
INTRAMUSCULAR | Status: DC | PRN
  Administered 2016-07-01: 30 mL via PERINEURAL

## 2016-07-01 MED ORDER — PROPOFOL 10 MG/ML IV BOLUS
INTRAVENOUS | Status: DC | PRN
  Administered 2016-07-01: 150 mg via INTRAVENOUS
  Administered 2016-07-01 (×2): 50 mg via INTRAVENOUS

## 2016-07-01 MED ORDER — EPHEDRINE SULFATE 50 MG/ML IJ SOLN
INTRAMUSCULAR | Status: DC | PRN
  Administered 2016-07-01 (×2): 10 mg via INTRAVENOUS

## 2016-07-01 MED ORDER — ROCURONIUM BROMIDE 100 MG/10ML IV SOLN
INTRAVENOUS | Status: DC | PRN
  Administered 2016-07-01 (×2): 30 mg via INTRAVENOUS

## 2016-07-01 MED ORDER — SUGAMMADEX SODIUM 500 MG/5ML IV SOLN
INTRAVENOUS | Status: DC | PRN
  Administered 2016-07-01: 269 mg via INTRAVENOUS

## 2016-07-01 MED ORDER — ONDANSETRON HCL 4 MG/2ML IJ SOLN
INTRAMUSCULAR | Status: AC
  Filled 2016-07-01: qty 2

## 2016-07-01 MED ORDER — EPHEDRINE 5 MG/ML INJ
INTRAVENOUS | Status: AC
  Filled 2016-07-01: qty 10

## 2016-07-01 SURGICAL SUPPLY — 81 items
BENZOIN TINCTURE PRP APPL 2/3 (GAUZE/BANDAGES/DRESSINGS) IMPLANT
BIT DRILL 3.2 (BIT) ×2
BIT DRILL 3.2XCALB NS DISP (BIT) ×1 IMPLANT
BIT DRILL CALIBRATED 2.7 (BIT) ×2 IMPLANT
BIT DRILL CALIBRATED 2.7MM (BIT) ×1
BIT DRL 3.2XCALB NS DISP (BIT) ×1
BLADE SURG 10 STRL SS (BLADE) ×3 IMPLANT
BLADE SURG 15 STRL LF DISP TIS (BLADE) ×1 IMPLANT
BLADE SURG 15 STRL SS (BLADE) ×2
CHLORAPREP W/TINT 26ML (MISCELLANEOUS) ×3 IMPLANT
CLEANER CAUTERY TIP 5X5 PAD (MISCELLANEOUS) ×1 IMPLANT
CLOSURE WOUND 1/2 X4 (GAUZE/BANDAGES/DRESSINGS)
DRAPE C-ARM 42X72 X-RAY (DRAPES) ×3 IMPLANT
DRAPE IMP U-DRAPE 54X76 (DRAPES) IMPLANT
DRAPE SURG 17X23 STRL (DRAPES) ×3 IMPLANT
DRAPE U-SHAPE 47X51 STRL (DRAPES) ×3 IMPLANT
DRAPE U-SHAPE 76X120 STRL (DRAPES) ×6 IMPLANT
DRSG ADAPTIC 3X8 NADH LF (GAUZE/BANDAGES/DRESSINGS) IMPLANT
DRSG PAD ABDOMINAL 8X10 ST (GAUZE/BANDAGES/DRESSINGS) ×3 IMPLANT
DRSG TEGADERM 4X4.75 (GAUZE/BANDAGES/DRESSINGS) IMPLANT
ELECT BLADE 6.5 .24CM SHAFT (ELECTRODE) IMPLANT
ELECT REM PT RETURN 9FT ADLT (ELECTROSURGICAL) ×3
ELECTRODE REM PT RTRN 9FT ADLT (ELECTROSURGICAL) ×1 IMPLANT
GAUZE SPONGE 4X4 12PLY STRL (GAUZE/BANDAGES/DRESSINGS) ×3 IMPLANT
GLOVE BIO SURGEON STRL SZ7.5 (GLOVE) ×3 IMPLANT
GLOVE BIOGEL PI IND STRL 7.0 (GLOVE) ×1 IMPLANT
GLOVE BIOGEL PI IND STRL 8 (GLOVE) ×1 IMPLANT
GLOVE BIOGEL PI INDICATOR 7.0 (GLOVE) ×2
GLOVE BIOGEL PI INDICATOR 8 (GLOVE) ×2
GLOVE ECLIPSE 6.5 STRL STRAW (GLOVE) ×3 IMPLANT
GOWN STRL REUS W/ TWL LRG LVL3 (GOWN DISPOSABLE) ×2 IMPLANT
GOWN STRL REUS W/TWL LRG LVL3 (GOWN DISPOSABLE) ×4
GOWN STRL REUS W/TWL XL LVL3 (GOWN DISPOSABLE) ×3 IMPLANT
K-WIRE 2X5 SS THRDED S3 (WIRE) ×9
KWIRE 2X5 SS THRDED S3 (WIRE) ×3 IMPLANT
LIQUID BAND (GAUZE/BANDAGES/DRESSINGS) IMPLANT
NS IRRIG 1000ML POUR BTL (IV SOLUTION) ×3 IMPLANT
PACK ARTHROSCOPY DSU (CUSTOM PROCEDURE TRAY) ×3 IMPLANT
PACK BASIN DAY SURGERY FS (CUSTOM PROCEDURE TRAY) ×3 IMPLANT
PAD CLEANER CAUTERY TIP 5X5 (MISCELLANEOUS) ×2
PEG LOCKING 3.2MMX44 (Peg) ×3 IMPLANT
PEG LOCKING 3.2X34 (Screw) ×6 IMPLANT
PEG LOCKING 3.2X36 (Screw) ×3 IMPLANT
PEG LOCKING 3.2X38 (Screw) ×3 IMPLANT
PEG LOCKING 3.2X40 (Peg) ×6 IMPLANT
PEG LOCKING 3.2X50 (Screw) ×3 IMPLANT
PEG LOCKING 3.2X52 (Peg) ×3 IMPLANT
PENCIL BUTTON HOLSTER BLD 10FT (ELECTRODE) ×3 IMPLANT
PLATE PROX HUMERUS HI LT 4H (Plate) ×3 IMPLANT
SCREW T15 MD 3.5X22MM NS (Screw) ×6 IMPLANT
SCREW T15 MD 3.5X24MM NS (Screw) ×6 IMPLANT
SHEET MEDIUM DRAPE 40X70 STRL (DRAPES) IMPLANT
SLEEVE MEASURING 3.2 (BIT) ×3 IMPLANT
SLEEVE SCD COMPRESS KNEE MED (MISCELLANEOUS) ×3 IMPLANT
SLING ARM FOAM STRAP LRG (SOFTGOODS) IMPLANT
SLING ARM IMMOBILIZER MED (SOFTGOODS) IMPLANT
SLING ARM MED ADULT FOAM STRAP (SOFTGOODS) IMPLANT
SPONGE LAP 18X18 X RAY DECT (DISPOSABLE) ×3 IMPLANT
SPONGE LAP 4X18 X RAY DECT (DISPOSABLE) IMPLANT
STOCKINETTE 6  STRL (DRAPES) ×2
STOCKINETTE 6 STRL (DRAPES) ×1 IMPLANT
STRIP CLOSURE SKIN 1/2X4 (GAUZE/BANDAGES/DRESSINGS) IMPLANT
SUCTION FRAZIER HANDLE 10FR (MISCELLANEOUS) ×2
SUCTION TUBE FRAZIER 10FR DISP (MISCELLANEOUS) ×1 IMPLANT
SUPPORT WRAP ARM LG (MISCELLANEOUS) ×3 IMPLANT
SUT FIBERWIRE #2 38 T-5 BLUE (SUTURE)
SUT VIC AB 0 CT1 18XCR BRD 8 (SUTURE) IMPLANT
SUT VIC AB 0 CT1 8-18 (SUTURE)
SUT VIC AB 0 SH 27 (SUTURE) IMPLANT
SUT VIC AB 2-0 CT3 27 (SUTURE) IMPLANT
SUT VIC AB 2-0 PS2 27 (SUTURE) IMPLANT
SUT VIC AB 2-0 SH 18 (SUTURE) IMPLANT
SUT VIC AB 2-0 SH 27 (SUTURE)
SUT VIC AB 2-0 SH 27XBRD (SUTURE) IMPLANT
SUT VICRYL RAPIDE 4-0 (SUTURE) IMPLANT
SUT VICRYL RAPIDE 4/0 PS 2 (SUTURE) IMPLANT
SUTURE FIBERWR #2 38 T-5 BLUE (SUTURE) IMPLANT
SYR BULB 3OZ (MISCELLANEOUS) ×3 IMPLANT
TOWEL OR 17X24 6PK STRL BLUE (TOWEL DISPOSABLE) ×3 IMPLANT
TOWEL OR NON WOVEN STRL DISP B (DISPOSABLE) ×3 IMPLANT
YANKAUER SUCT BULB TIP NO VENT (SUCTIONS) ×3 IMPLANT

## 2016-07-01 NOTE — Progress Notes (Signed)
Assisted Dr. Moser with left, ultrasound guided, interscalene  block. Side rails up, monitors on throughout procedure. See vital signs in flow sheet. Tolerated Procedure well. 

## 2016-07-01 NOTE — Anesthesia Procedure Notes (Signed)
Anesthesia Regional Block:  Interscalene brachial plexus block  Pre-Anesthetic Checklist: ,, timeout performed, Correct Patient, Correct Site, Correct Laterality, Correct Procedure, Correct Position, site marked, Risks and benefits discussed,  Surgical consent,  Pre-op evaluation,  At surgeon's request and post-op pain management  Laterality: Upper and Left  Prep: chloraprep       Needles:  Injection technique: Single-shot  Needle Type: Echogenic Stimulator Needle          Additional Needles:  Procedures: ultrasound guided (picture in chart) Interscalene brachial plexus block Narrative:  Injection made incrementally with aspirations every 5 mL.  Performed by: Personally  Anesthesiologist: Trae Bovenzi  Additional Notes: H+P and labs reviewed, risks and benefits discussed with patient, procedure tolerated well without complications

## 2016-07-01 NOTE — Anesthesia Postprocedure Evaluation (Signed)
Anesthesia Post Note  Patient: Brandi [name]  Procedure(s) Performed: Procedure(s) (LRB): OPEN TREATMENT OF LEFT  PROXIMAL HUMERUS FRACTURE (Left)  Patient location during evaluation: PACU Anesthesia Type: Regional and General Level of consciousness: awake Pain management: pain level controlled Vital Signs Assessment: post-procedure vital signs reviewed and stable Respiratory status: spontaneous breathing Cardiovascular status: stable Postop Assessment: no signs of nausea or vomiting Anesthetic complications: no    Last Vitals:  Vitals:   07/01/16 1315 07/01/16 1330  BP: 105/79 106/60  Pulse: 100 98  Resp: (!) 29 16  Temp:      Last Pain:  Vitals:   07/01/16 1330  TempSrc:   PainSc: 0-No pain                 Lamontae Ricardo

## 2016-07-01 NOTE — Anesthesia Preprocedure Evaluation (Signed)
Anesthesia Evaluation  Patient identified by MRN, date of birth, ID band Patient awake    Reviewed: Allergy & Precautions, NPO status , Patient's Chart, lab work & pertinent test results  History of Anesthesia Complications (+) PONV and history of anesthetic complications  Airway Mallampati: II  TM Distance: >3 FB Neck ROM: Full    Dental  (+) Teeth Intact   Pulmonary neg shortness of breath, sleep apnea , neg COPD,    breath sounds clear to auscultation       Cardiovascular hypertension, Pt. on medications (-) angina(-) Past MI and (-) CHF  Rhythm:Regular     Neuro/Psych PSYCHIATRIC DISORDERS Anxiety Depression negative neurological ROS     GI/Hepatic negative GI ROS, Neg liver ROS,   Endo/Other  diabetes, Type 2Morbid obesity  Renal/GU negative Renal ROS     Musculoskeletal  (+) Arthritis ,   Abdominal   Peds  Hematology   Anesthesia Other Findings   Reproductive/Obstetrics                             Anesthesia Physical Anesthesia Plan  ASA: III  Anesthesia Plan: General   Post-op Pain Management:  Regional for Post-op pain   Induction: Intravenous  Airway Management Planned: Oral ETT  Additional Equipment: None  Intra-op Plan:   Post-operative Plan: Extubation in OR  Informed Consent: I have reviewed the patients History and Physical, chart, labs and discussed the procedure including the risks, benefits and alternatives for the proposed anesthesia with the patient or authorized representative who has indicated his/her understanding and acceptance.   Dental advisory given  Plan Discussed with: CRNA and Surgeon  Anesthesia Plan Comments:         Anesthesia Quick Evaluation

## 2016-07-01 NOTE — Anesthesia Procedure Notes (Signed)
Procedure Name: Intubation Date/Time: 07/01/2016 10:23 AM Performed by: Rayvon Char Pre-anesthesia Checklist: Patient identified, Emergency Drugs available, Suction available and Patient being monitored Patient Re-evaluated:Patient Re-evaluated prior to inductionOxygen Delivery Method: Circle system utilized Preoxygenation: Pre-oxygenation with 100% oxygen Intubation Type: IV induction Ventilation: Mask ventilation without difficulty Laryngoscope Size: Glidescope Tube type: Oral Number of attempts: 1 Airway Equipment and Method: Video-laryngoscopy Placement Confirmation: ETT inserted through vocal cords under direct vision and positive ETCO2 Secured at: 22 cm Dental Injury: Teeth and Oropharynx as per pre-operative assessment

## 2016-07-01 NOTE — Progress Notes (Signed)
Dr. Grandville Silos returned call and advised to change or reinforce dressing as needed.  Assisted Shara Blazing, RN to change dressing.  Advised patient and support person to note drainage at home and call MD if continues to saturate dressing.  They confirmed understanding.

## 2016-07-01 NOTE — Transfer of Care (Signed)
Immediate Anesthesia Transfer of Care Note  Patient: Brandi [name]  Procedure(s) Performed: Procedure(s) with comments: OPEN TREATMENT OF LEFT  PROXIMAL HUMERUS FRACTURE (Left) - GENERAL ANESTHESIA WITH PRE-OP BLOCK  Patient Location: PACU  Anesthesia Type:General  Level of Consciousness: awake, alert  and oriented  Airway & Oxygen Therapy: Patient Spontanous Breathing and Patient connected to face mask oxygen  Post-op Assessment: Report given to RN and Post -op Vital signs reviewed and stable  Post vital signs: Reviewed and stable  Last Vitals:  Vitals:   07/01/16 0944 07/01/16 0945  BP:    Pulse: 90 91  Resp: (!) 23 18  Temp:      Last Pain:  Vitals:   07/01/16 0907  TempSrc: Oral         Complications: No apparent anesthesia complications

## 2016-07-01 NOTE — Discharge Instructions (Addendum)
Discharge Instructions   You have a light dressing on your hand.  You may begin gentle motion of your fingers and hand immediately, but you should not do any heavy lifting or gripping.  Elevate your hand to reduce pain & swelling of the digits.  Ice over the operative site may be helpful to reduce pain & swelling.  DO NOT USE HEAT. Pain medicine has been prescribed for you.  Use your medicine as needed over the first 48 hours, and then you can begin to taper your use. You may use Tylenol in place of your prescribed pain medication, but not IN ADDITION to it. Leave the dressing in place until the third day after your surgery and then remove it, leaving it open to air.  After the bandage has been removed you may shower, regularly washing the incision and letting the water run over it, but not submerging it (no swimming, soaking it in dishwater, etc.) You may drive a car when you are off of prescription pain medications and can safely control your vehicle with both hands. We will address whether therapy will be required or not when you return to the office. You may have already made your follow-up appointment when we completed your preop visit.  If not, please call our office today or the next business day to make your return appointment for 10-15 days after surgery.   Please call [phone] during normal business hours or [phone] after hours for any problems. Including the following:  - excessive redness of the incisions - drainage for more than 4 days - fever of more than 101.5 F  *Please note that pain medications will not be refilled after hours or on weekends.   Post Anesthesia Home Care Instructions  Activity: Get plenty of rest for the remainder of the day. A responsible adult should stay with you for 24 hours following the procedure.  For the next 24 hours, DO NOT: -Drive a car -Paediatric nurse -Drink alcoholic beverages -Take any medication unless instructed by your  physician -Make any legal decisions or sign important papers.  Meals: Start with liquid foods such as gelatin or soup. Progress to regular foods as tolerated. Avoid greasy, spicy, heavy foods. If nausea and/or vomiting occur, drink only clear liquids until the nausea and/or vomiting subsides. Call your physician if vomiting continues.  Special Instructions/Symptoms: Your throat may feel dry or sore from the anesthesia or the breathing tube placed in your throat during surgery. If this causes discomfort, gargle with warm salt water. The discomfort should disappear within 24 hours.  If you had a scopolamine patch placed behind your ear for the management of post- operative nausea and/or vomiting:  1. The medication in the patch is effective for 72 hours, after which it should be removed.  Wrap patch in a tissue and discard in the trash. Wash hands thoroughly with soap and water. 2. You may remove the patch earlier than 72 hours if you experience unpleasant side effects which may include dry mouth, dizziness or visual disturbances. 3. Avoid touching the patch. Wash your hands with soap and water after contact with the patch.   Regional Anesthesia Blocks  1. Numbness or the inability to move the "blocked" extremity may last from 3-48 hours after placement. The length of time depends on the medication injected and your individual response to the medication. If the numbness is not going away after 48 hours, call your surgeon.  2. The extremity that is blocked will need to be  protected until the numbness is gone and the  Strength has returned. Because you cannot feel it, you will need to take extra care to avoid injury. Because it may be weak, you may have difficulty moving it or using it. You may not know what position it is in without looking at it while the block is in effect.  3. For blocks in the legs and feet, returning to weight bearing and walking needs to be done carefully. You will need to  wait until the numbness is entirely gone and the strength has returned. You should be able to move your leg and foot normally before you try and bear weight or walk. You will need someone to be with you when you first try to ensure you do not fall and possibly risk injury.  4. Bruising and tenderness at the needle site are common side effects and will resolve in a few days.  5. Persistent numbness or new problems with movement should be communicated to the surgeon or the [facility] [phone] [place] [phone]).

## 2016-07-01 NOTE — Interval H&P Note (Signed)
History and Physical Interval Note:  07/01/2016 9:10 AM  Brandi [name]  has presented today for surgery, with the diagnosis of LEFT PROXIMAL HUMERAL FRACTURE S42.292A  The various methods of treatment have been discussed with the patient and family. After consideration of risks, benefits and other options for treatment, the patient has consented to  Procedure(s) with comments: OPEN TREATMENT OF LEFT  PROXIMAL HUMERUS FRACTURE (Left) - GENERAL ANESTHESIA WITH PRE-OP BLOCK as a surgical intervention .  The patient's history has been reviewed, patient examined, no change in status, stable for surgery.  I have reviewed the patient's chart and labs.  Questions were answered to the patient's satisfaction.     Laquitha Heslin A.

## 2016-07-01 NOTE — Op Note (Signed)
07/01/2016  9:11 AM  PATIENT:  Brandi [name]  70 y.o. female  PRE-OPERATIVE DIAGNOSIS:  Displaced left proximal humerus fracture  POST-OPERATIVE DIAGNOSIS:  Same  PROCEDURE:  ORIF displaced left proximal humerus fracture  SURGEON: Rayvon Char. Grandville Silos, MD  PHYSICIAN ASSISTANT: Morley Kos, OPA-C   ANESTHESIA:  regional and general  SPECIMENS:  None  DRAINS:   None  EBL:  200 mL  PREOPERATIVE INDICATIONS:  Brandi [name] is a  70 y.o. female with a displaced left proximal humerus fracture  The risks benefits and alternatives were discussed with the patient preoperatively including but not limited to the risks of infection, bleeding, nerve injury, cardiopulmonary complications, the need for revision surgery, among others, and the patient verbalized understanding and consented to proceed.  OPERATIVE IMPLANTS: 4-hole Biomet ALPS proximal humerus plate/screws/pegs  OPERATIVE PROCEDURE:  After receiving prophylactic antibiotics and a regional block, the patient was escorted to the operative theatre and placed in a supine position.  General anesthesia was adminstered.  A surgical "time-out" was performed during which the planned procedure, proposed operative site, and the correct patient identity were compared to the operative consent and agreement confirmed by the circulating nurse according to current facility policy.  Following repositioning in a beachchair positioner, the limb was prepped with DuraPrep and draped in typical sterile fashion.  A standard deltopectoral approach was made sharply with a scalpel, deeper dissection with blunt dissection with scissors and Bovie electrocautery.  The deltoid was retracted and the fracture exposed.  0 Vicryl sutures were placed into the anterior and posterior cuff and tied together to help close down, and the Biomet plate sits slightly higher was applied to the shaft, its positioning confirmed fluoroscopically and secured with a screw through the  slotted hole.  The proximal pieces were then reduced to the plate and using a suture placed into the supraspinatus, and help to better align the articular surface, restoring the appropriate angle.  The head was secured to the plate provisionally with K wires, alignment checked fluoroscopically, then the proximal phalanx were all drilled and filled with smooth pegs locked into the plate.  The shaft portion of the plate was then secured to the shaft with nonlocking screws.#2 max braid suture was placed into the anterior and middle portions of the cuff and secured to the plate, helping to better secure the tuberosity fracture fragments to the plate and creating a more solidly combined construct.  Final images were obtained, the hardware was noted to be extra-articular.  The wound was irrigated, deltopectoral split reapproximated with 3-0 Vicryl interrupted sutures which was also used to reapproximate the subcutaneous and dermal layers followed by staples at the skin.  A dressing was applied, and she was awakened and taken to the recovery room stable condition, breathing spontaneously, with arm secured in a sling.    DISPOSITION: She'll be discharged home today with typical instructions, returning in 10-15 days for reevaluation with new x-rays of the left shoulder sling and removal of staples wound appears appropriate.

## 2016-07-02 ENCOUNTER — Encounter (HOSPITAL_BASED_OUTPATIENT_CLINIC_OR_DEPARTMENT_OTHER): Payer: Self-pay | Admitting: Orthopedic Surgery

## 2018-10-26 ENCOUNTER — Other Ambulatory Visit: Payer: Self-pay | Admitting: Nephrology

## 2018-10-26 ENCOUNTER — Other Ambulatory Visit (HOSPITAL_COMMUNITY): Payer: Self-pay | Admitting: Nephrology

## 2018-10-26 DIAGNOSIS — R809 Proteinuria, unspecified: Secondary | ICD-10-CM

## 2018-10-26 DIAGNOSIS — N182 Chronic kidney disease, stage 2 (mild): Secondary | ICD-10-CM

## 2018-11-09 ENCOUNTER — Ambulatory Visit (HOSPITAL_COMMUNITY)
Admission: RE | Admit: 2018-11-09 | Discharge: 2018-11-09 | Disposition: A | Discharge status: Home / Self Care | Payer: Medicare Other | Source: Ambulatory Visit | Attending: Nephrology | Admitting: Nephrology

## 2018-11-09 DIAGNOSIS — R809 Proteinuria, unspecified: Secondary | ICD-10-CM | POA: Insufficient documentation

## 2018-11-09 DIAGNOSIS — N182 Chronic kidney disease, stage 2 (mild): Secondary | ICD-10-CM | POA: Insufficient documentation

## 2019-04-10 ENCOUNTER — Encounter (HOSPITAL_COMMUNITY): Payer: Self-pay

## 2019-04-10 ENCOUNTER — Ambulatory Visit (HOSPITAL_COMMUNITY)
Admission: EM | Admit: 2019-04-10 | Discharge: 2019-04-10 | Disposition: A | Discharge status: Home / Self Care | Payer: Medicare Other | Attending: Family Medicine | Admitting: Family Medicine

## 2019-04-10 ENCOUNTER — Other Ambulatory Visit: Payer: Self-pay

## 2019-04-10 DIAGNOSIS — E11628 Type 2 diabetes mellitus with other skin complications: Secondary | ICD-10-CM

## 2019-04-10 DIAGNOSIS — M7989 Other specified soft tissue disorders: Secondary | ICD-10-CM

## 2019-04-10 MED ORDER — AMOXICILLIN-POT CLAVULANATE 875-125 MG PO TABS: 1.0000 | ORAL_TABLET | Freq: Two times a day (BID) | ORAL | 0 refills | Status: DC

## 2019-04-10 NOTE — ED Triage Notes (Signed)
Pt states her right foot middle toe is swollen and red. X 2 days. Pt toe is dark on the end as well.

## 2019-04-12 NOTE — ED Provider Notes (Signed)
[facility]   [id number] 04/10/19 Arrival Time: 8099  ASSESSMENT & PLAN:  1. Toe swelling   2. Diabetes with skin complication (HCC)    Question pressure related to new shoes. No active skin breakdown/ulcerations. She feels better starting antibiotic "just in case". Encourage to check this foot regularly over the next few days and return with any worsening.  Begin: Meds ordered this encounter  Medications  . amoxicillin-clavulanate (AUGMENTIN) 875-125 MG tablet    Sig: Take 1 tablet by mouth every 12 (twelve) hours.    Dispense:  20 tablet    Refill:  0    Follow-up Information    [facility].   Specialty:  Urgent Care Why:  As needed. Contact information: [place] [phone]          Reviewed expectations re: course of current medical issues. Questions answered. Outlined signs and symptoms indicating need for more acute intervention. Patient verbalized understanding. After Visit Summary given.  SUBJECTIVE: History from: patient. Brandi [name] is a 73 y.o. female who reports "a little redness" over her dorsal R 3rd toe. Noticed yesterday. Has been wearing new pair of shoes; questions relation. No significant pain. Is a diabetic. "Really worry about my feet." Also reports that "toe was a little darker yesterday but that is better now." No bleeding. Ambulatory without difficulty. No home treatment. No specific aggravating or alleviating factors reported. No injury/trauma reported.  Past Surgical History:  Procedure Laterality Date  . APPENDECTOMY    . ARTERY EXPLORATION  08/25/2012   Procedure: ARTERY EXPLORATION;  Surgeon: Wynonia Sours, MD;  Location: [facility];  Service: Orthopedics;  Laterality: Right;  Application External Fixator Exploration Digital Artery Right Little Finger/Exploration Artery and Nerve Right Ring Finger  . BREAST LUMPECTOMY     right  .  BREAST SURGERY     multiple br bx-  . CERVICAL POLYPECTOMY    . EXTERNAL FIXATION REMOVAL  10/07/2012   Procedure: REMOVAL EXTERNAL FIXATION ARM;  Surgeon: Wynonia Sours, MD;  Location: [facility];  Service: Orthopedics;  Laterality: Right;  . HERNIA REPAIR     abd  . LUMBAR LAMINECTOMY  1985  . NERVE EXPLORATION  08/25/2012   Procedure: NERVE EXPLORATION;  Surgeon: Wynonia Sours, MD;  Location: [facility];  Service: Orthopedics;  Laterality: Right;  . ORIF HUMERUS FRACTURE Left 07/01/2016   Procedure: OPEN TREATMENT OF LEFT  PROXIMAL HUMERUS FRACTURE;  Surgeon: Milly Jakob, MD;  Location: [facility];  Service: Orthopedics;  Laterality: Left;  GENERAL ANESTHESIA WITH PRE-OP BLOCK  . PROXIMAL INTERPHALANGEAL FUSION (PIP)  10/07/2012   Procedure: PROXIMAL INTERPHALANGEAL FUSION (PIP);  Surgeon: Wynonia Sours, MD;  Location: [facility];  Service: Orthopedics;  Laterality: Right;  FUSION PIP RIGHT SMALL FINGER, DISTAL RADIUS GRAFT, REMOVAL OF EXTERNAL FIXATOR, SYNTHESE MINI     ROS: As per HPI. All other systems negative.    OBJECTIVE:  Vitals:   04/10/19 1744 04/10/19 1748  BP:  (!) 145/114  Pulse:  98  Resp:  20  Temp:  98.4 F (36.9 C)  SpO2:  100%  Weight: 127 kg     General appearance: alert; no distress HEENT: [place]; AT Neck: supple with FROM CV: RRR Resp: unlabored respirations Extremities: . RLE: warm; approx 1 cm area of erythema over dorsal mid R 3rd toe; slightly warm when compared to surrounding skin; no  fluctuance; no drainage or bleeding; normal ROM; distal toe with thickened/dry/hard skin ("this is normal for me"); no specific pain to palpation; capillary refill appears normal Skin: warm and dry Neurologic: gait normal Psychological: alert and cooperative; normal mood and affect  Imaging: No results found.    No Known Allergies  Past Medical History:  Diagnosis Date  . Anxiety   . Arthritis   .  Cancer ([place])    breast  . Depression   . Diabetes mellitus without complication ([place])   . Hyperlipemia   . Hypertension   . PONV (postoperative nausea and vomiting)   . Sleep apnea    uses a cpap   Social History   Socioeconomic History  . Marital status: Single    Spouse name: Not on file  . Number of children: Not on file  . Years of education: Not on file  . Highest education level: Not on file  Occupational History  . Not on file  Social Needs  . Financial resource strain: Not on file  . Food insecurity:    Worry: Not on file    Inability: Not on file  . Transportation needs:    Medical: Not on file    Non-medical: Not on file  Tobacco Use  . Smoking status: Never Smoker  . Smokeless tobacco: Never Used  Substance and Sexual Activity  . Alcohol use: Yes    Comment: occ  . Drug use: No  . Sexual activity: Not on file  Lifestyle  . Physical activity:    Days per week: Not on file    Minutes per session: Not on file  . Stress: Not on file  Relationships  . Social connections:    Talks on phone: Not on file    Gets together: Not on file    Attends religious service: Not on file    Active member of club or organization: Not on file    Attends meetings of clubs or organizations: Not on file    Relationship status: Not on file  Other Topics Concern  . Not on file  Social History Narrative  . Not on file   History reviewed. No pertinent family history. Past Surgical History:  Procedure Laterality Date  . APPENDECTOMY    . ARTERY EXPLORATION  08/25/2012   Procedure: ARTERY EXPLORATION;  Surgeon: Wynonia Sours, MD;  Location: [facility];  Service: Orthopedics;  Laterality: Right;  Application External Fixator Exploration Digital Artery Right Little Finger/Exploration Artery and Nerve Right Ring Finger  . BREAST LUMPECTOMY     right  . BREAST SURGERY     multiple br bx-  . CERVICAL POLYPECTOMY    . EXTERNAL FIXATION REMOVAL  10/07/2012    Procedure: REMOVAL EXTERNAL FIXATION ARM;  Surgeon: Wynonia Sours, MD;  Location: [facility];  Service: Orthopedics;  Laterality: Right;  . HERNIA REPAIR     abd  . LUMBAR LAMINECTOMY  1985  . NERVE EXPLORATION  08/25/2012   Procedure: NERVE EXPLORATION;  Surgeon: Wynonia Sours, MD;  Location: [facility];  Service: Orthopedics;  Laterality: Right;  . ORIF HUMERUS FRACTURE Left 07/01/2016   Procedure: OPEN TREATMENT OF LEFT  PROXIMAL HUMERUS FRACTURE;  Surgeon: Milly Jakob, MD;  Location: [facility];  Service: Orthopedics;  Laterality: Left;  GENERAL ANESTHESIA WITH PRE-OP BLOCK  . PROXIMAL INTERPHALANGEAL FUSION (PIP)  10/07/2012   Procedure: PROXIMAL INTERPHALANGEAL FUSION (PIP);  Surgeon: Wynonia Sours, MD;  Location:  [facility];  Service: Orthopedics;  Laterality: Right;  FUSION PIP RIGHT SMALL FINGER, DISTAL RADIUS GRAFT, REMOVAL OF EXTERNAL FIXATOR, SYNTHESE MINI      Vanessa Kick, MD 04/12/19 [phone]

## 2019-08-10 IMAGING — US RENAL/URINARY TRACT ULTRASOUND
1 series · 14 of 25 positions shown · non-contrast
Comparison: None.

CLINICAL DATA: Proteinuria.  Chronic kidney disease stage II.

EXAM:
RENAL / URINARY TRACT ULTRASOUND COMPLETE

[Series 1: renal/urinary tract ultrasound · 0.22mm/px · 14 of 46 slices shown]
[im 1/46]
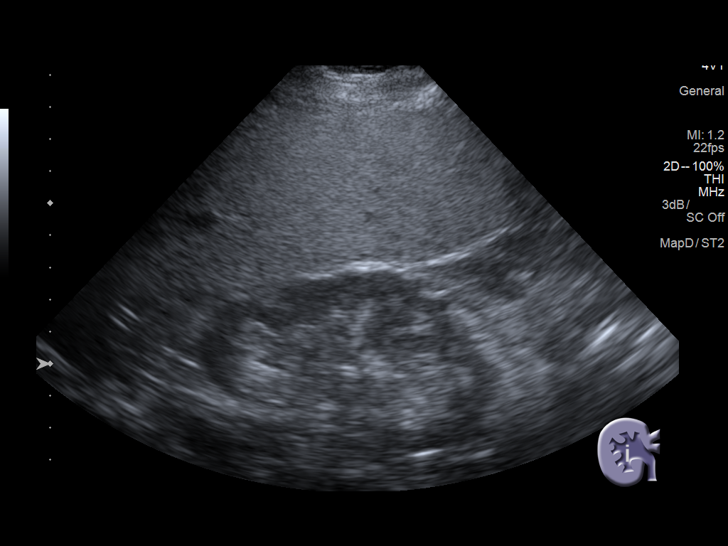
[im 4/46]
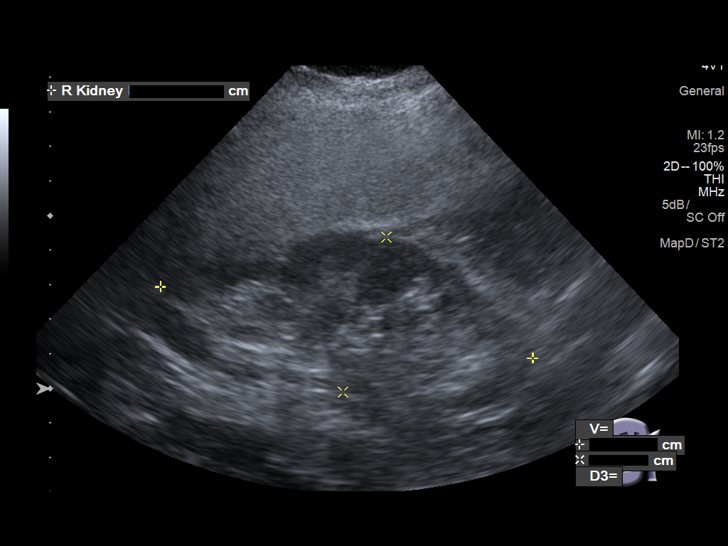
[im 8/46]
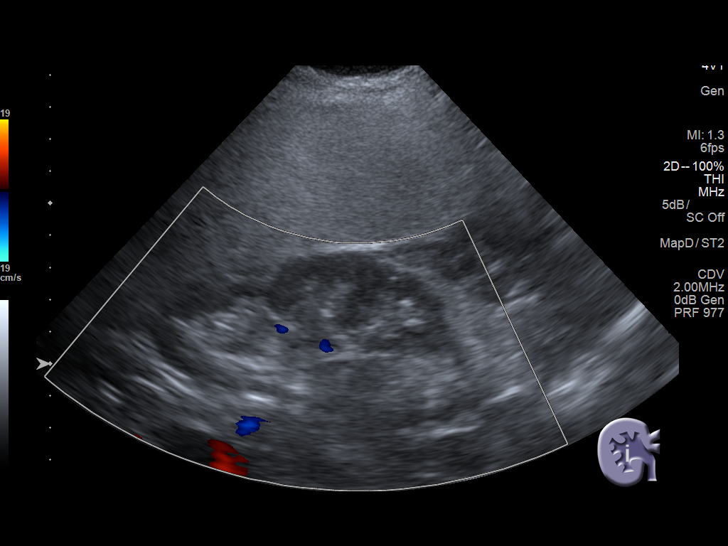
[im 12/46]
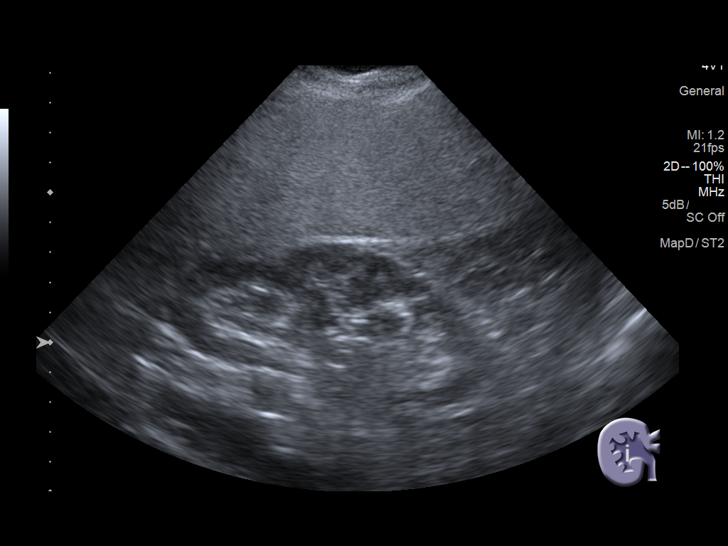
[im 16/46]
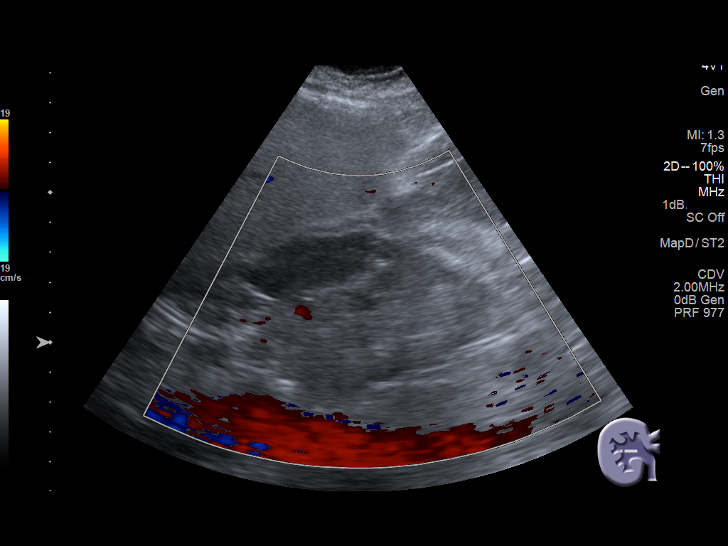
[im 17/46]
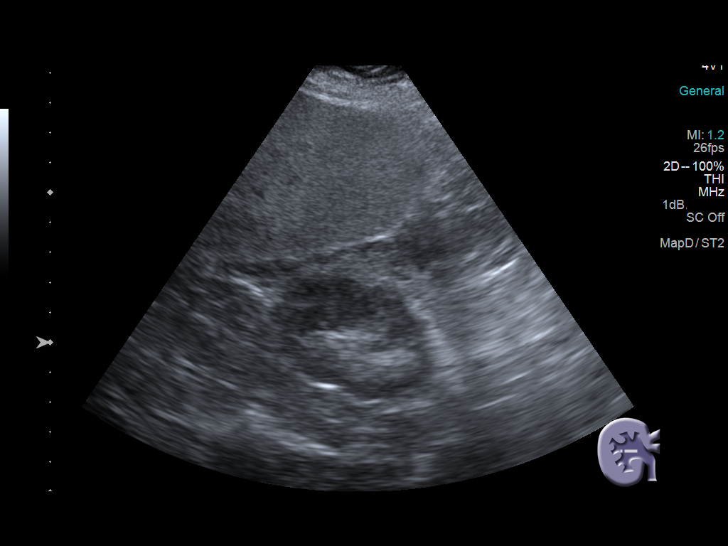
[im 21/46]
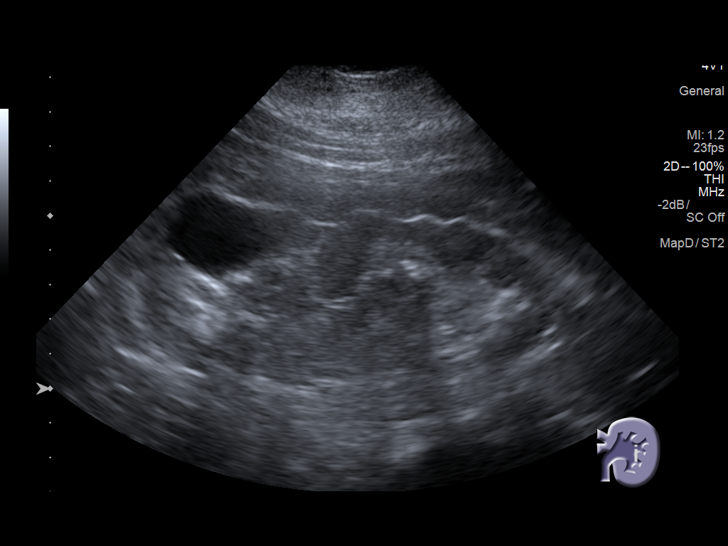
[im 25/46]
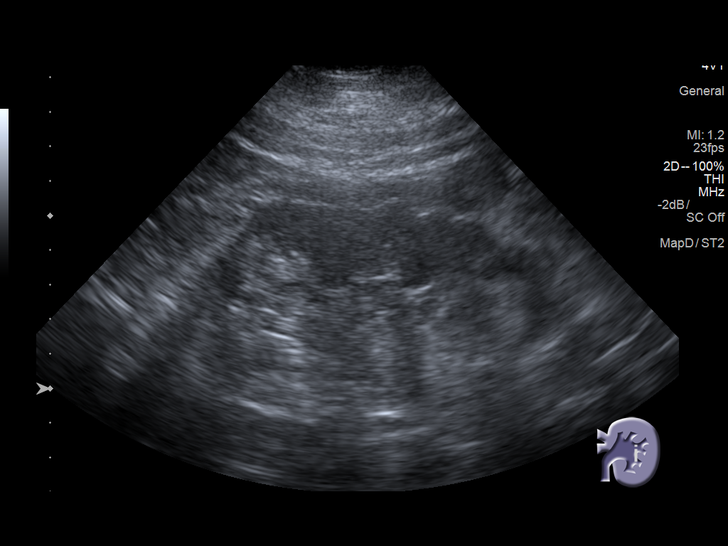
[im 29/46]
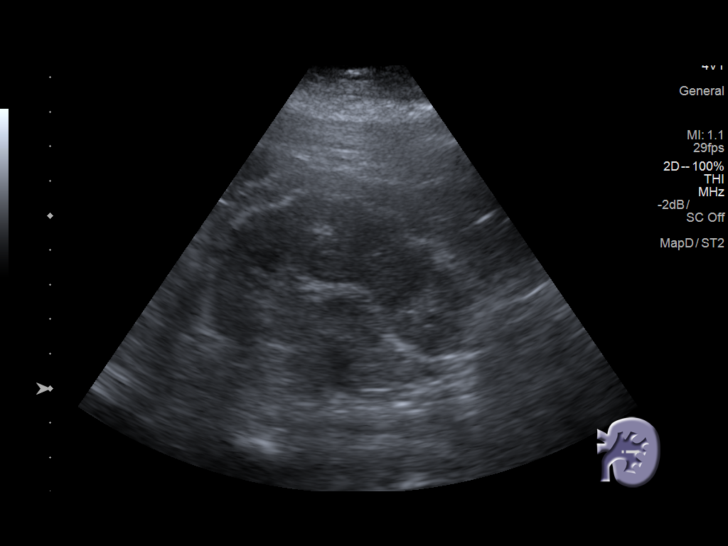
[im 31/46]
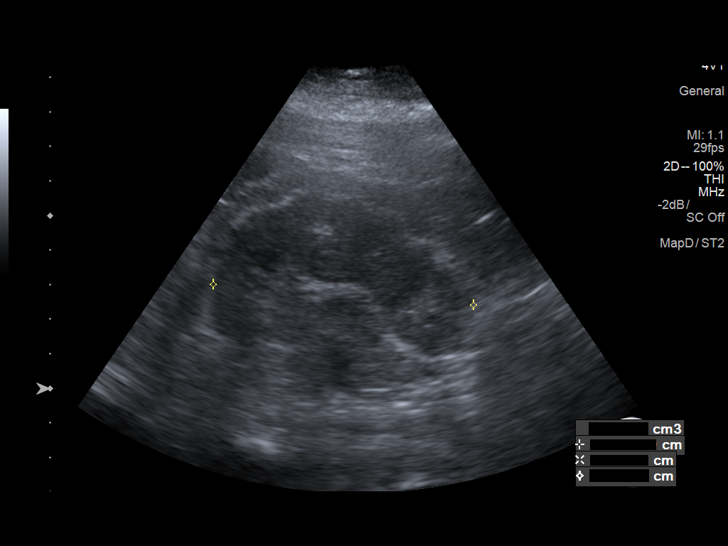
[im 34/46]
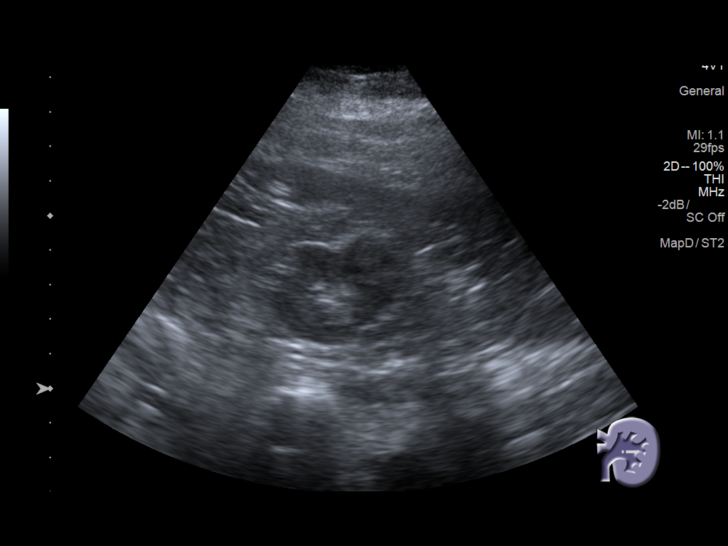
[im 38/46]
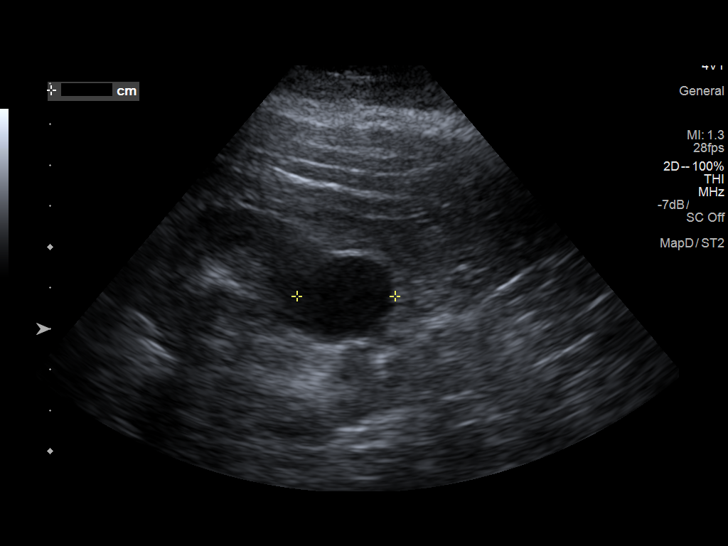
[im 42/46]
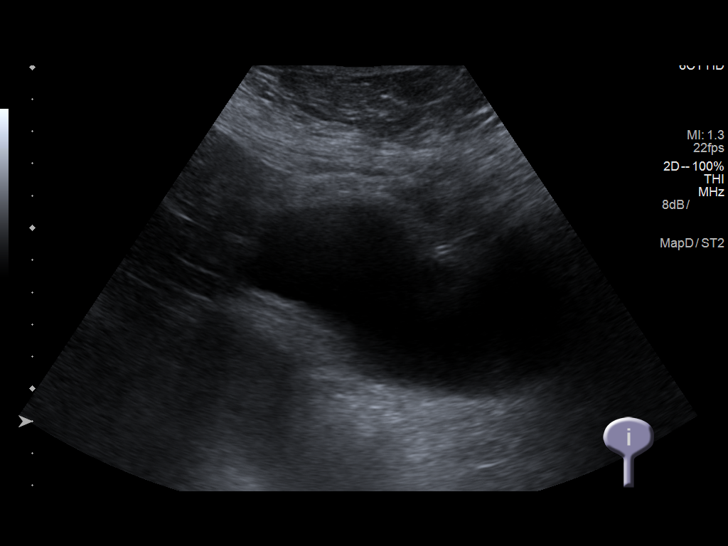
[im 46/46]
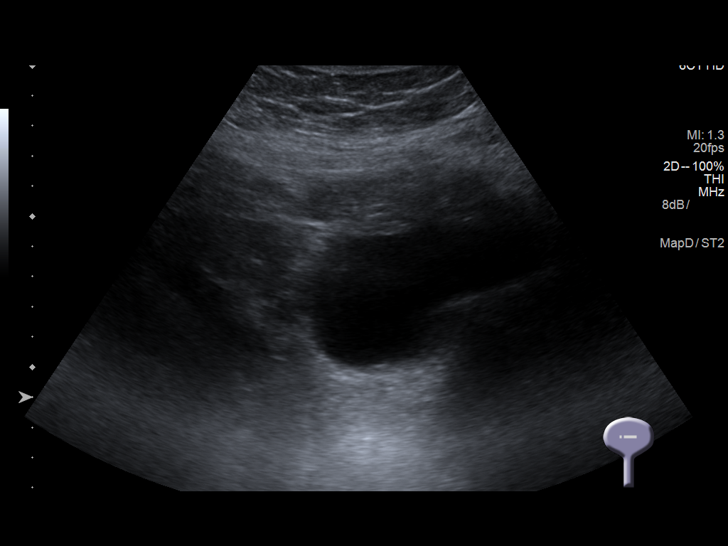

[14 of 25 positions shown; findings below may reference images not displayed]

FINDINGS: Right Kidney:

Renal measurements: 11.0 x 8.3 x 4.6 cm = volume: 221 mL .
Echogenicity within normal limits. No mass or hydronephrosis
visualized.

Left Kidney:

Renal measurements: 12.9 x 7.6 x 5.0 cm = volume: 253 mL. 3 cm
simple cyst is seen in upper pole. Echogenicity within normal
limits. No mass or hydronephrosis visualized.

Bladder:

Appears normal for degree of bladder distention.
IMPRESSION: 3 cm simple left renal cyst is noted. No other renal abnormality is
noted.

## 2022-07-17 ENCOUNTER — Telehealth: Payer: Self-pay | Admitting: Oncology

## 2022-07-17 NOTE — Telephone Encounter (Signed)
Scheduled appt per 9/11 referral. Pt is aware of appt date and time. Pt is aware to arrive 15 mins prior to appt time and to bring and updated insurance card. Pt is aware of appt location.   

## 2022-08-06 NOTE — Progress Notes (Signed)
[facility] Initial Visit:  Patient Care Team: System, Provider Not In as PCP - General  CHIEF COMPLAINTS/PURPOSE OF CONSULTATION:  Oncology History   No history exists.    HISTORY OF PRESENTING ILLNESS: Brandi [name] 76 y.o. female is here because of leukocytosis Medical history notable for breast cancer, diabetes mellitus type 2, hyperlipidemia, sleep apnea treated with CPAP, hypertension, cervical polyp, cataracts  July 15, 2022: WBC 14.4 hemoglobin 12.8 platelet count 235; 51 segs 17 lymphs 5 monos 27 eos absolute eosinophil count 3.85 CMP notable for creatinine 1.01 glucose 180.  Hemoglobin A1c 8.9  August 09 2022:  [facility] Hematology Consult  Patient reports that she has a long history of granulocytosis.   Has a history of left sided breast cancer diagnosed > 15 yrs ago, treated with lumpectomy followed by Tamoxifen which she elected to take for 7 yrs.   DM Type II has not been under great control last Hgb A1c was 7.3.  Uses Astelin nasal spray every day when allergies bother her.  Last used Flonase 1 month ago.  Had a bout of cellulitis related to spider bite treated with doxycycline.    Social:  Single.  Retired.  Former Science writer.  Tobacco none.  EtOH none  [place] Mother died 51's dementia Father died 51 MI No siblings   Review of Systems  Constitutional:  Positive for unexpected weight change. Negative for appetite change, chills, fatigue and fever.       Has lost 20 lbs in the last few weeks in setting of cellulitis  HENT:   Negative for hearing loss, lump/mass, mouth sores, nosebleeds, sore throat, tinnitus, trouble swallowing and voice change.   Eyes:  Negative for eye problems and icterus.       Vision changes:  None  Respiratory:  Negative for chest tightness, cough, hemoptysis, shortness of breath and wheezing.        PND:  none Orthopnea:  none DOE:    Cardiovascular:  Negative for chest pain, leg swelling and  palpitations.       PND:  none Orthopnea:  none  Gastrointestinal:  Negative for abdominal distention, abdominal pain, blood in stool, constipation, diarrhea, nausea and vomiting.  Endocrine: Negative for hot flashes.       Cold intolerance:  none Heat intolerance:  none  Genitourinary:  Negative for bladder incontinence, difficulty urinating, dysuria, frequency, hematuria and nocturia.   Musculoskeletal:  Positive for arthralgias and gait problem. Negative for back pain, myalgias, neck pain and neck stiffness.       Arthralgias of knees, elbows, hands  Skin:  Negative for itching, rash and wound.  Neurological:  Positive for gait problem and numbness. Negative for dizziness, extremity weakness, headaches, light-headedness and speech difficulty.       Gait problems because of knee problems Has neuropathy which mainly involves the feet  Hematological:  Negative for adenopathy. Does not bruise/bleed easily.  Psychiatric/Behavioral:  Negative for sleep disturbance and suicidal ideas. The patient is not nervous/anxious.     MEDICAL HISTORY: Past Medical History:  Diagnosis Date   Anxiety    Arthritis    Cancer ([place])    breast   Depression    Diabetes mellitus without complication ([place])    Hyperlipemia    Hypertension    PONV (postoperative nausea and vomiting)    Sleep apnea    uses a cpap    SURGICAL HISTORY: Past Surgical History:  Procedure Laterality Date   APPENDECTOMY  ARTERY EXPLORATION  08/25/2012   Procedure: ARTERY EXPLORATION;  Surgeon: Wynonia Sours, MD;  Location: [facility];  Service: Orthopedics;  Laterality: Right;  Application External Fixator Exploration Digital Artery Right Little Finger/Exploration Artery and Nerve Right Ring Finger   BREAST LUMPECTOMY     right   BREAST SURGERY     multiple br bx-   CERVICAL POLYPECTOMY     EXTERNAL FIXATION REMOVAL  10/07/2012   Procedure: REMOVAL EXTERNAL FIXATION ARM;  Surgeon: Wynonia Sours, MD;   Location: [facility];  Service: Orthopedics;  Laterality: Right;   HERNIA REPAIR     abd   LUMBAR LAMINECTOMY  1985   NERVE EXPLORATION  08/25/2012   Procedure: NERVE EXPLORATION;  Surgeon: Wynonia Sours, MD;  Location: [facility];  Service: Orthopedics;  Laterality: Right;   ORIF HUMERUS FRACTURE Left 07/01/2016   Procedure: OPEN TREATMENT OF LEFT  PROXIMAL HUMERUS FRACTURE;  Surgeon: Milly Jakob, MD;  Location: [facility];  Service: Orthopedics;  Laterality: Left;  GENERAL ANESTHESIA WITH PRE-OP BLOCK   PROXIMAL INTERPHALANGEAL FUSION (PIP)  10/07/2012   Procedure: PROXIMAL INTERPHALANGEAL FUSION (PIP);  Surgeon: Wynonia Sours, MD;  Location: [facility];  Service: Orthopedics;  Laterality: Right;  FUSION PIP RIGHT SMALL FINGER, DISTAL RADIUS GRAFT, REMOVAL OF EXTERNAL FIXATOR, SYNTHESE MINI    SOCIAL HISTORY: Social History   Socioeconomic History   Marital status: Single    Spouse name: Not on file   Number of children: Not on file   Years of education: Not on file   Highest education level: Not on file  Occupational History   Not on file  Tobacco Use   Smoking status: Never   Smokeless tobacco: Never  Substance and Sexual Activity   Alcohol use: Yes    Comment: occ   Drug use: No   Sexual activity: Not on file  Other Topics Concern   Not on file  Social History Narrative   Not on file   Social Determinants of Health   Financial Resource Strain: Not on file  Food Insecurity: Not on file  Transportation Needs: Not on file  Physical Activity: Not on file  Stress: Not on file  Social Connections: Not on file  Intimate Partner Violence: Not on file    FAMILY HISTORY No family history on file.  ALLERGIES:  has No Known Allergies.  MEDICATIONS:  Current Outpatient Medications  Medication Sig Dispense Refill   amitriptyline (ELAVIL) 50 MG tablet Take 50 mg by mouth at bedtime.     amoxicillin-clavulanate  (AUGMENTIN) 875-125 MG tablet Take 1 tablet by mouth every 12 (twelve) hours. 20 tablet 0   azelastine (ASTELIN) 0.1 % nasal spray 1 puff in each nostril Nasally Once a day for 30 day(s)     citalopram (CELEXA) 10 MG tablet Take 10 mg by mouth daily.     Continuous Blood Gluc Sensor (FREESTYLE LIBRE 3 SENSOR) MISC daily.     fluticasone (FLONASE) 50 MCG/ACT nasal spray INHALE 1 SPRAY INTO EACH NOSTRIL EVERY DAY for 60     gabapentin (NEURONTIN) 300 MG capsule Take 300 mg by mouth every morning.     gabapentin (NEURONTIN) 600 MG tablet Take 600 mg by mouth at bedtime.     glipiZIDE (GLUCOTROL) 10 MG tablet Take 10 mg by mouth 2 (two) times daily before a meal.     hydrochlorothiazide (HYDRODIURIL) 25 MG tablet Take 25 mg by mouth daily.  HYDROcodone-acetaminophen (NORCO/VICODIN) 5-325 MG per tablet Take 1 tablet by mouth every 6 (six) hours as needed.     lisinopril (PRINIVIL,ZESTRIL) 10 MG tablet Take 10 mg by mouth daily. Takes 1/2-=61m     metFORMIN (GLUCOPHAGE) 1000 MG tablet Take 1,000 mg by mouth 2 (two) times daily with a meal.     montelukast (SINGULAIR) 10 MG tablet 1 tablet Orally Once a day for 90 days     MYRBETRIQ 50 MG TB24 tablet Take 50 mg by mouth daily.     omega-3 acid ethyl esters (LOVAZA) 1 G capsule Take 2 g by mouth 2 (two) times daily.     oxyCODONE-acetaminophen (ROXICET) 5-325 MG tablet Take 1-2 tablets by mouth every 6 (six) hours as needed for severe pain. 80 tablet 0   OZEMPIC, 0.25 OR 0.5 MG/DOSE, 2 MG/3ML SOPN Inject into the skin.     potassium chloride (K-DUR,KLOR-CON) 10 MEQ tablet Take 10 mEq by mouth daily.     pravastatin (PRAVACHOL) 10 MG tablet Take 10 mg by mouth daily.     sitaGLIPtin (JANUVIA) 50 MG tablet Take 50 mg by mouth daily.     traMADol (ULTRAM) 50 MG tablet 2 tablets as needed Orally every 6-8 hours for 30 days     No current facility-administered medications for this visit.    PHYSICAL EXAMINATION:  ECOG PERFORMANCE STATUS: 1 -  Symptomatic but completely ambulatory   Vitals:   08/09/22 1259  BP: (!) 158/75  Pulse: 95  Temp: 97.7 F (36.5 C)  SpO2: 97%    Filed Weights   08/09/22 1259  Weight: 269 lb 4.8 oz (122.2 kg)     Physical Exam Vitals and nursing note reviewed.  Constitutional:      General: She is not in acute distress.    Appearance: Normal appearance. She is obese. She is not ill-appearing, toxic-appearing or diaphoretic.     Comments: Here with female friend.  Elderly  HENT:     Head: Normocephalic and atraumatic.     Right Ear: External ear normal.     Left Ear: External ear normal.     Nose: Nose normal. No congestion or rhinorrhea.  Eyes:     General: No scleral icterus.    Extraocular Movements: Extraocular movements intact.     Conjunctiva/sclera: Conjunctivae normal.     Pupils: Pupils are equal, round, and reactive to light.  Cardiovascular:     Rate and Rhythm: Normal rate and regular rhythm.     Heart sounds: Normal heart sounds. No murmur heard.    No friction rub. No gallop.  Pulmonary:     Effort: Pulmonary effort is normal. No respiratory distress.     Breath sounds: Normal breath sounds. No stridor. No wheezing or rhonchi.  Abdominal:     General: Bowel sounds are normal. There is no distension.     Palpations: Abdomen is soft. There is no mass.     Tenderness: There is no abdominal tenderness. There is no guarding or rebound.     Hernia: No hernia is present.  Musculoskeletal:        General: No swelling, tenderness, deformity or signs of injury.     Cervical back: Normal range of motion and neck supple. No rigidity or tenderness.     Right lower leg: No edema.     Left lower leg: No edema.  Lymphadenopathy:     Head:     Right side of head: No submental, submandibular, tonsillar, preauricular, posterior  auricular or occipital adenopathy.     Left side of head: No submental, submandibular, tonsillar, preauricular, posterior auricular or occipital adenopathy.      Cervical: No cervical adenopathy.     Right cervical: No superficial, deep or posterior cervical adenopathy.    Left cervical: No superficial, deep or posterior cervical adenopathy.     Upper Body:     Right upper body: No supraclavicular, axillary, pectoral or epitrochlear adenopathy.     Left upper body: No supraclavicular, axillary, pectoral or epitrochlear adenopathy.  Skin:    General: Skin is warm.     Coloration: Skin is not jaundiced or pale.  Neurological:     General: No focal deficit present.     Mental Status: She is alert and oriented to person, place, and time. Mental status is at baseline.     Cranial Nerves: No cranial nerve deficit.  Psychiatric:        Mood and Affect: Mood normal.        Behavior: Behavior normal.        Thought Content: Thought content normal.        Judgment: Judgment normal.      LABORATORY DATA: I have personally reviewed the data as listed:  Appointment on 08/09/2022  Component Date Value Ref Range Status   Sed Rate 08/09/2022 30 (H)  0 - 22 mm/hr Final   Performed at [facility], [place] [address], [place], [place] [address]   Cortisol, Plasma 08/09/2022 7.4  ug/dL Final   Comment: (NOTE) AM    6.7 - 22.6 ug/dL PM   <10.0       ug/dL Performed at [facility] [address] [place], [place] [address]    CRP 08/09/2022 1.4 (H)  <1.0 mg/dL Final   Performed at [facility] [address], [place], [place] [address]   JAK 2, MPL, Glendell Docker, [facility] 08/09/2022 See Scanned report in [place]   Final   Performed at [facility], [place] [address] [place], [place] [address]   Sodium 08/09/2022 140  135 - 145 mmol/L Final   Potassium 08/09/2022 4.1  3.5 - 5.1 mmol/L Final   Chloride 08/09/2022 101  98 - 111 mmol/L Final   CO2 08/09/2022 29  22 - 32 mmol/L Final   Glucose, Bld 08/09/2022 178 (H)  70 - 99 mg/dL Final   Glucose reference range applies only to samples taken after fasting  for at least 8 hours.   BUN 08/09/2022 15  8 - 23 mg/dL Final   Creatinine 08/09/2022 1.03 (H)  0.44 - 1.00 mg/dL Final   Calcium 08/09/2022 9.3  8.9 - 10.3 mg/dL Final   Total Protein 08/09/2022 7.0  6.5 - 8.1 g/dL Final   Albumin 08/09/2022 4.1  3.5 - 5.0 g/dL Final   AST 08/09/2022 11 (L)  15 - 41 U/L Final   ALT 08/09/2022 12  0 - 44 U/L Final   Alkaline Phosphatase 08/09/2022 71  38 - 126 U/L Final   Total Bilirubin 08/09/2022 0.5  0.3 - 1.2 mg/dL Final   GFR, Estimated 08/09/2022 57 (L)  >60 mL/min Final   Comment: (NOTE) Calculated using the CKD-EPI Creatinine Equation (2021)    Anion gap 08/09/2022 10  5 - 15 Final   Performed at [facility], [place] [address], [place], [place] [address]   WBC Count 08/09/2022 16.8 (H)  4.0 - 10.5 K/uL Final  RBC 08/09/2022 4.51  3.87 - 5.11 MIL/uL Final   Hemoglobin 08/09/2022 13.2  12.0 - 15.0 g/dL Final   HCT 08/09/2022 39.9  36.0 - 46.0 % Final   MCV 08/09/2022 88.5  80.0 - 100.0 fL Final   MCH 08/09/2022 29.3  26.0 - 34.0 pg Final   MCHC 08/09/2022 33.1  30.0 - 36.0 g/dL Final   RDW 08/09/2022 13.7  11.5 - 15.5 % Final   Platelet Count 08/09/2022 338  150 - 400 K/uL Final   nRBC 08/09/2022 0.0  0.0 - 0.2 % Final   Neutrophils Relative % 08/09/2022 75  % Final   Neutro Abs 08/09/2022 12.7 (H)  1.7 - 7.7 K/uL Final   Lymphocytes Relative 08/09/2022 16  % Final   Lymphs Abs 08/09/2022 2.7  0.7 - 4.0 K/uL Final   Monocytes Relative 08/09/2022 4  % Final   Monocytes Absolute 08/09/2022 0.7  0.1 - 1.0 K/uL Final   Eosinophils Relative 08/09/2022 3  % Final   Eosinophils Absolute 08/09/2022 0.4  0.0 - 0.5 K/uL Final   Basophils Relative 08/09/2022 1  % Final   Basophils Absolute 08/09/2022 0.1  0.0 - 0.1 K/uL Final   Immature Granulocytes 08/09/2022 1  % Final   Abs Immature Granulocytes 08/09/2022 0.11 (H)  0.00 - 0.07 K/uL Final   Performed at [facility], [place] [address]  [place], [place] [address]  Office Visit on 08/09/2022  Component Date Value Ref Range Status   BCR ABL1 / ABL1 08/09/2022 See Scanned report in [place]   Final   Performed at [facility], [address], [place], [place] [address]    RADIOGRAPHIC STUDIES: I have personally reviewed the radiological images as listed and agree with the findings in the report  No results found.  ASSESSMENT/PLAN  Main Causes of Neutrophilia Acute infections Bacterial Various pyogenic cocci, Escherichia coli, Pseudomonas aeruginosa, Corynebacterium diphtheriae, Francisella tularensis   Spirochaetal Syphilis, Leptospirosis   Rickettsial Typhus, Rocky Mountain spotted fever   Chlamydial psittacosis   Protozoal Pneumocystis carinii infection   Mycotic actinomycosis, coccidioidomycosis   Helmenthic liver fluke, filariasis  Acute Inflammation not caused by infection  RA, rheumatic fever, vasculitis, myositis, hypersensitivity reactions.  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  Review medications.        Cancer Staging  No matching staging information was found for the patient.   No problem-specific Assessment & Plan notes found for this encounter.   Orders Placed This Encounter  Procedures   CBC with Differential ([facility] Only)    Standing Status:   Future    Number of Occurrences:   1    Standing Expiration Date:   08/10/2023   CMP ([facility] only)    Standing Status:   Future     Number of Occurrences:   1    Standing Expiration Date:   08/10/2023   BCR ABL1 FISH ([facility])   JAK2 (INCLUDING V617F AND EXON 12), MPL,& CALR W/RFL MPN PANEL ([facility])    Standing Status:  Future    Number of Occurrences:   1    Standing Expiration Date:   08/10/2023   C-reactive protein    Standing Status:   Future    Number of Occurrences:   1    Standing Expiration Date:   08/10/2023   Cortisol    Standing Status:   Future    Number of Occurrences:   1    Standing Expiration Date:   08/10/2023   Sedimentation rate    Standing Status:   Future    Number of Occurrences:   1    Standing Expiration Date:   08/09/2023    All questions were answered. The patient knows to call the clinic with any problems, questions or concerns.  This note was electronically signed.    Barbee Cough, MD  08/16/2022 10:00 AM

## 2022-08-09 ENCOUNTER — Other Ambulatory Visit: Payer: Self-pay

## 2022-08-09 ENCOUNTER — Inpatient Hospital Stay: Payer: Medicare Other

## 2022-08-09 ENCOUNTER — Inpatient Hospital Stay: Payer: Medicare Other | Attending: Oncology | Admitting: Oncology

## 2022-08-09 DIAGNOSIS — G4730 Sleep apnea, unspecified: Secondary | ICD-10-CM | POA: Diagnosis not present

## 2022-08-09 DIAGNOSIS — E1165 Type 2 diabetes mellitus with hyperglycemia: Secondary | ICD-10-CM | POA: Diagnosis not present

## 2022-08-09 DIAGNOSIS — Z853 Personal history of malignant neoplasm of breast: Secondary | ICD-10-CM | POA: Diagnosis not present

## 2022-08-09 DIAGNOSIS — D72829 Elevated white blood cell count, unspecified: Secondary | ICD-10-CM | POA: Diagnosis present

## 2022-08-09 DIAGNOSIS — D72828 Other elevated white blood cell count: Secondary | ICD-10-CM

## 2022-08-09 DIAGNOSIS — I10 Essential (primary) hypertension: Secondary | ICD-10-CM | POA: Insufficient documentation

## 2022-08-09 DIAGNOSIS — E1136 Type 2 diabetes mellitus with diabetic cataract: Secondary | ICD-10-CM | POA: Diagnosis not present

## 2022-08-09 DIAGNOSIS — E785 Hyperlipidemia, unspecified: Secondary | ICD-10-CM | POA: Diagnosis not present

## 2022-08-09 LAB — CMP (CANCER CENTER ONLY)
ALT: 12 U/L (ref 0–44)
AST: 11 U/L — ABNORMAL LOW (ref 15–41)
Albumin: 4.1 g/dL (ref 3.5–5.0)
Alkaline Phosphatase: 71 U/L (ref 38–126)
Anion gap: 10 (ref 5–15)
BUN: 15 mg/dL (ref 8–23)
CO2: 29 mmol/L (ref 22–32)
Calcium: 9.3 mg/dL (ref 8.9–10.3)
Chloride: 101 mmol/L (ref 98–111)
Creatinine: 1.03 mg/dL — ABNORMAL HIGH (ref 0.44–1.00)
GFR, Estimated: 57 mL/min — ABNORMAL LOW (ref >60)
Glucose, Bld: 178 mg/dL — ABNORMAL HIGH (ref 70–99)
Potassium: 4.1 mmol/L (ref 3.5–5.1)
Sodium: 140 mmol/L (ref 135–145)
Total Bilirubin: 0.5 mg/dL (ref 0.3–1.2)
Total Protein: 7 g/dL (ref 6.5–8.1)

## 2022-08-09 LAB — CBC WITH DIFFERENTIAL (CANCER CENTER ONLY)
Abs Immature Granulocytes: 0.11 10*3/uL — ABNORMAL HIGH (ref 0.00–0.07)
Basophils Absolute: 0.1 10*3/uL (ref 0.0–0.1)
Basophils Relative: 1 %
Eosinophils Absolute: 0.4 10*3/uL (ref 0.0–0.5)
Eosinophils Relative: 3 %
HCT: 39.9 % (ref 36.0–46.0)
Hemoglobin: 13.2 g/dL (ref 12.0–15.0)
Immature Granulocytes: 1 %
Lymphocytes Relative: 16 %
Lymphs Abs: 2.7 10*3/uL (ref 0.7–4.0)
MCH: 29.3 pg (ref 26.0–34.0)
MCHC: 33.1 g/dL (ref 30.0–36.0)
MCV: 88.5 fL (ref 80.0–100.0)
Monocytes Absolute: 0.7 10*3/uL (ref 0.1–1.0)
Monocytes Relative: 4 %
Neutro Abs: 12.7 10*3/uL — ABNORMAL HIGH (ref 1.7–7.7)
Neutrophils Relative %: 75 %
Platelet Count: 338 10*3/uL (ref 150–400)
RBC: 4.51 MIL/uL (ref 3.87–5.11)
RDW: 13.7 % (ref 11.5–15.5)
WBC Count: 16.8 10*3/uL — ABNORMAL HIGH (ref 4.0–10.5)
nRBC: 0 % (ref 0.0–0.2)

## 2022-08-09 LAB — C-REACTIVE PROTEIN: CRP: 1.4 mg/dL — ABNORMAL HIGH (ref <1.0)

## 2022-08-09 LAB — CORTISOL: Cortisol, Plasma: 7.4 ug/dL

## 2022-08-09 LAB — SEDIMENTATION RATE: Sed Rate: 30 mm/hr — ABNORMAL HIGH (ref 0–22)

## 2022-08-13 ENCOUNTER — Telehealth: Payer: Self-pay | Admitting: Oncology

## 2022-08-13 NOTE — Telephone Encounter (Signed)
Scheduled appt per 10/6 los. Pt aware.  

## 2022-08-14 LAB — JAK2 (INCLUDING V617F AND EXON 12), MPL,& CALR W/RFL MPN PANEL (NGS)

## 2022-08-14 LAB — BCR ABL1 FISH (GENPATH)

## 2022-08-16 DIAGNOSIS — E119 Type 2 diabetes mellitus without complications: Secondary | ICD-10-CM | POA: Insufficient documentation

## 2022-08-16 DIAGNOSIS — E669 Obesity, unspecified: Secondary | ICD-10-CM | POA: Insufficient documentation

## 2022-08-29 NOTE — Progress Notes (Signed)
[facility] Initial Visit:  Patient Care Team: System, Provider Not In as PCP - General  CHIEF COMPLAINTS/PURPOSE OF CONSULTATION:  Oncology History   No history exists.    HISTORY OF PRESENTING ILLNESS: Brandi [name] 76 y.o. female is here because of leukocytosis Medical history notable for breast cancer, diabetes mellitus type 2, hyperlipidemia, sleep apnea treated with CPAP, hypertension, cervical polyp, cataracts  July 15, 2022: WBC 14.4 hemoglobin 12.8 platelet count 235; 51 segs 17 lymphs 5 monos 27 eos absolute eosinophil count 3.85 CMP notable for creatinine 1.01 glucose 180.  Hemoglobin A1c 8.9  August 09 2022:  [facility] Hematology Consult  Patient reports that she has a long history of granulocytosis.   Has a history of left sided breast cancer diagnosed > 15 yrs ago, treated with lumpectomy followed by Tamoxifen which she elected to take for 7 yrs.   DM Type II has not been under great control last Hgb A1c was 7.3.  Uses Astelin nasal spray every day when allergies bother her.  Last used Flonase 1 month ago.  Had a bout of cellulitis related to spider bite treated with doxycycline.    Social:  Single.  Retired.  Former Science writer.  Tobacco none.  EtOH none  [place] Mother died 13's dementia Father died 68 MI No siblings  WBC 16.8 hemoglobin 13.2 platelet count 338; 75 segs 16 lymphs 4 monos 3 eos 1 basophil. JAK2 NGS panel negative.  FISH for BCR able negative Chemistry is notable for glucose of 178 creatinine 1.03 Sed rate 30.  Cortisol 7.4  August 30 2022:  Scheduled follow up regarding granulocytosis Reviewed results of labs with patient and friend   Review of Systems  Constitutional:  Positive for unexpected weight change. Negative for appetite change, chills, fatigue and fever.       Has lost 20 lbs in the last few weeks in setting of cellulitis  HENT:   Negative for hearing loss, lump/mass, mouth sores, nosebleeds, sore  throat, tinnitus, trouble swallowing and voice change.   Eyes:  Negative for eye problems and icterus.       Vision changes:  None  Respiratory:  Negative for chest tightness, cough, hemoptysis, shortness of breath and wheezing.        PND:  none Orthopnea:  none DOE:    Cardiovascular:  Negative for chest pain, leg swelling and palpitations.       PND:  none Orthopnea:  none  Gastrointestinal:  Negative for abdominal distention, abdominal pain, blood in stool, constipation, diarrhea, nausea and vomiting.  Endocrine: Negative for hot flashes.       Cold intolerance:  none Heat intolerance:  none  Genitourinary:  Negative for bladder incontinence, difficulty urinating, dysuria, frequency, hematuria and nocturia.   Musculoskeletal:  Positive for arthralgias and gait problem. Negative for back pain, myalgias, neck pain and neck stiffness.       Arthralgias of knees, elbows, hands  Skin:  Negative for itching, rash and wound.  Neurological:  Positive for gait problem and numbness. Negative for dizziness, extremity weakness, headaches, light-headedness and speech difficulty.       Gait problems because of knee problems Has neuropathy which mainly involves the feet  Hematological:  Negative for adenopathy. Does not bruise/bleed easily.  Psychiatric/Behavioral:  Negative for sleep disturbance and suicidal ideas. The patient is not nervous/anxious.     MEDICAL HISTORY: Past Medical History:  Diagnosis Date   Anxiety    Arthritis  Cancer ([facility])    breast   Depression    Diabetes mellitus without complication ([place])    Hyperlipemia    Hypertension    PONV (postoperative nausea and vomiting)    Sleep apnea    uses a cpap    SURGICAL HISTORY: Past Surgical History:  Procedure Laterality Date   APPENDECTOMY     ARTERY EXPLORATION  08/25/2012   Procedure: ARTERY EXPLORATION;  Surgeon: Wynonia Sours, MD;  Location: [facility];  Service: Orthopedics;  Laterality: Right;   Application External Fixator Exploration Digital Artery Right Little Finger/Exploration Artery and Nerve Right Ring Finger   BREAST LUMPECTOMY     right   BREAST SURGERY     multiple br bx-   CERVICAL POLYPECTOMY     EXTERNAL FIXATION REMOVAL  10/07/2012   Procedure: REMOVAL EXTERNAL FIXATION ARM;  Surgeon: Wynonia Sours, MD;  Location: [facility];  Service: Orthopedics;  Laterality: Right;   HERNIA REPAIR     abd   LUMBAR LAMINECTOMY  1985   NERVE EXPLORATION  08/25/2012   Procedure: NERVE EXPLORATION;  Surgeon: Wynonia Sours, MD;  Location: [facility];  Service: Orthopedics;  Laterality: Right;   ORIF HUMERUS FRACTURE Left 07/01/2016   Procedure: OPEN TREATMENT OF LEFT  PROXIMAL HUMERUS FRACTURE;  Surgeon: Milly Jakob, MD;  Location: [facility];  Service: Orthopedics;  Laterality: Left;  GENERAL ANESTHESIA WITH PRE-OP BLOCK   PROXIMAL INTERPHALANGEAL FUSION (PIP)  10/07/2012   Procedure: PROXIMAL INTERPHALANGEAL FUSION (PIP);  Surgeon: Wynonia Sours, MD;  Location: [facility];  Service: Orthopedics;  Laterality: Right;  FUSION PIP RIGHT SMALL FINGER, DISTAL RADIUS GRAFT, REMOVAL OF EXTERNAL FIXATOR, SYNTHESE MINI    SOCIAL HISTORY: Social History   Socioeconomic History   Marital status: Single    Spouse name: Not on file   Number of children: Not on file   Years of education: Not on file   Highest education level: Not on file  Occupational History   Not on file  Tobacco Use   Smoking status: Never   Smokeless tobacco: Never  Substance and Sexual Activity   Alcohol use: Yes    Comment: occ   Drug use: No   Sexual activity: Not on file  Other Topics Concern   Not on file  Social History Narrative   Not on file   Social Determinants of Health   Financial Resource Strain: Not on file  Food Insecurity: Not on file  Transportation Needs: Not on file  Physical Activity: Not on file  Stress: Not on file  Social  Connections: Not on file  Intimate Partner Violence: Not on file    FAMILY HISTORY No family history on file.  ALLERGIES:  has No Known Allergies.  MEDICATIONS:  Current Outpatient Medications  Medication Sig Dispense Refill   amitriptyline (ELAVIL) 50 MG tablet Take 50 mg by mouth at bedtime.     amoxicillin-clavulanate (AUGMENTIN) 875-125 MG tablet Take 1 tablet by mouth every 12 (twelve) hours. 20 tablet 0   azelastine (ASTELIN) 0.1 % nasal spray 1 puff in each nostril Nasally Once a day for 30 day(s)     citalopram (CELEXA) 10 MG tablet Take 10 mg by mouth daily.     Continuous Blood Gluc Sensor (FREESTYLE LIBRE 3 SENSOR) MISC daily.     fluticasone (FLONASE) 50 MCG/ACT nasal spray INHALE 1 SPRAY INTO EACH NOSTRIL EVERY DAY for 60     gabapentin (  NEURONTIN) 300 MG capsule Take 300 mg by mouth every morning.     gabapentin (NEURONTIN) 600 MG tablet Take 600 mg by mouth at bedtime.     glipiZIDE (GLUCOTROL) 10 MG tablet Take 10 mg by mouth 2 (two) times daily before a meal.     hydrochlorothiazide (HYDRODIURIL) 25 MG tablet Take 25 mg by mouth daily.     HYDROcodone-acetaminophen (NORCO/VICODIN) 5-325 MG per tablet Take 1 tablet by mouth every 6 (six) hours as needed.     lisinopril (PRINIVIL,ZESTRIL) 10 MG tablet Take 10 mg by mouth daily. Takes 1/2-='5mg'$      metFORMIN (GLUCOPHAGE) 1000 MG tablet Take 1,000 mg by mouth 2 (two) times daily with a meal.     montelukast (SINGULAIR) 10 MG tablet 1 tablet Orally Once a day for 90 days     MYRBETRIQ 50 MG TB24 tablet Take 50 mg by mouth daily.     omega-3 acid ethyl esters (LOVAZA) 1 G capsule Take 2 g by mouth 2 (two) times daily.     oxyCODONE-acetaminophen (ROXICET) 5-325 MG tablet Take 1-2 tablets by mouth every 6 (six) hours as needed for severe pain. 80 tablet 0   OZEMPIC, 0.25 OR 0.5 MG/DOSE, 2 MG/3ML SOPN Inject into the skin.     potassium chloride (K-DUR,KLOR-CON) 10 MEQ tablet Take 10 mEq by mouth daily.     pravastatin  (PRAVACHOL) 10 MG tablet Take 10 mg by mouth daily.     sitaGLIPtin (JANUVIA) 50 MG tablet Take 50 mg by mouth daily.     traMADol (ULTRAM) 50 MG tablet 2 tablets as needed Orally every 6-8 hours for 30 days     No current facility-administered medications for this visit.    PHYSICAL EXAMINATION:  ECOG PERFORMANCE STATUS: 1 - Symptomatic but completely ambulatory   There were no vitals filed for this visit.   There were no vitals filed for this visit.    Physical Exam Vitals and nursing note reviewed.  Constitutional:      General: She is not in acute distress.    Appearance: Normal appearance. She is obese. She is not ill-appearing, toxic-appearing or diaphoretic.     Comments: Here with female friend.  Elderly  HENT:     Head: Normocephalic and atraumatic.     Right Ear: External ear normal.     Left Ear: External ear normal.     Nose: Nose normal. No congestion or rhinorrhea.  Eyes:     General: No scleral icterus.    Extraocular Movements: Extraocular movements intact.     Conjunctiva/sclera: Conjunctivae normal.     Pupils: Pupils are equal, round, and reactive to light.  Cardiovascular:     Rate and Rhythm: Normal rate and regular rhythm.     Heart sounds: Normal heart sounds. No murmur heard.    No friction rub. No gallop.  Pulmonary:     Effort: Pulmonary effort is normal. No respiratory distress.     Breath sounds: Normal breath sounds. No stridor. No wheezing or rhonchi.  Abdominal:     General: Bowel sounds are normal. There is no distension.     Palpations: Abdomen is soft. There is no mass.     Tenderness: There is no abdominal tenderness. There is no guarding or rebound.     Hernia: No hernia is present.  Musculoskeletal:        General: No swelling, tenderness, deformity or signs of injury.     Cervical back: Normal range of motion  and neck supple. No rigidity or tenderness.     Right lower leg: No edema.     Left lower leg: No edema.   Lymphadenopathy:     Head:     Right side of head: No submental, submandibular, tonsillar, preauricular, posterior auricular or occipital adenopathy.     Left side of head: No submental, submandibular, tonsillar, preauricular, posterior auricular or occipital adenopathy.     Cervical: No cervical adenopathy.     Right cervical: No superficial, deep or posterior cervical adenopathy.    Left cervical: No superficial, deep or posterior cervical adenopathy.     Upper Body:     Right upper body: No supraclavicular, axillary, pectoral or epitrochlear adenopathy.     Left upper body: No supraclavicular, axillary, pectoral or epitrochlear adenopathy.  Skin:    General: Skin is warm.     Coloration: Skin is not jaundiced or pale.  Neurological:     General: No focal deficit present.     Mental Status: She is alert and oriented to person, place, and time. Mental status is at baseline.     Cranial Nerves: No cranial nerve deficit.  Psychiatric:        Mood and Affect: Mood normal.        Behavior: Behavior normal.        Thought Content: Thought content normal.        Judgment: Judgment normal.     LABORATORY DATA: I have personally reviewed the data as listed:  Appointment on 08/09/2022  Component Date Value Ref Range Status   Sed Rate 08/09/2022 30 (H)  0 - 22 mm/hr Final   Performed at [facility], [place] [address] [place], [place] [address]   Cortisol, Plasma 08/09/2022 7.4  ug/dL Final   Comment: (NOTE) AM    6.7 - 22.6 ug/dL PM   <10.0       ug/dL Performed at [facility] [address], [place], [place] [address]    CRP 08/09/2022 1.4 (H)  <1.0 mg/dL Final   Performed at [facility] [address], [place], [place] [address]   JAK 2, MPL, Glendell Docker, [facility] 08/09/2022 See Scanned report in [place]   Final   Performed at [facility], [place] [address], [place], [place] [address]   Sodium 08/09/2022 140  135 - 145 mmol/L  Final   Potassium 08/09/2022 4.1  3.5 - 5.1 mmol/L Final   Chloride 08/09/2022 101  98 - 111 mmol/L Final   CO2 08/09/2022 29  22 - 32 mmol/L Final   Glucose, Bld 08/09/2022 178 (H)  70 - 99 mg/dL Final   Glucose reference range applies only to samples taken after fasting for at least 8 hours.   BUN 08/09/2022 15  8 - 23 mg/dL Final   Creatinine 08/09/2022 1.03 (H)  0.44 - 1.00 mg/dL Final   Calcium 08/09/2022 9.3  8.9 - 10.3 mg/dL Final   Total Protein 08/09/2022 7.0  6.5 - 8.1 g/dL Final   Albumin 08/09/2022 4.1  3.5 - 5.0 g/dL Final   AST 08/09/2022 11 (L)  15 - 41 U/L Final   ALT 08/09/2022 12  0 - 44 U/L Final   Alkaline Phosphatase 08/09/2022 71  38 - 126 U/L Final   Total Bilirubin 08/09/2022 0.5  0.3 - 1.2 mg/dL Final   GFR, Estimated 08/09/2022 57 (L)  >60 mL/min Final   Comment: (NOTE) Calculated using the CKD-EPI  Creatinine Equation (2021)    Anion gap 08/09/2022 10  5 - 15 Final   Performed at [facility], [place] [address] [place], [place] [address]   WBC Count 08/09/2022 16.8 (H)  4.0 - 10.5 K/uL Final   RBC 08/09/2022 4.51  3.87 - 5.11 MIL/uL Final   Hemoglobin 08/09/2022 13.2  12.0 - 15.0 g/dL Final   HCT 08/09/2022 39.9  36.0 - 46.0 % Final   MCV 08/09/2022 88.5  80.0 - 100.0 fL Final   MCH 08/09/2022 29.3  26.0 - 34.0 pg Final   MCHC 08/09/2022 33.1  30.0 - 36.0 g/dL Final   RDW 08/09/2022 13.7  11.5 - 15.5 % Final   Platelet Count 08/09/2022 338  150 - 400 K/uL Final   nRBC 08/09/2022 0.0  0.0 - 0.2 % Final   Neutrophils Relative % 08/09/2022 75  % Final   Neutro Abs 08/09/2022 12.7 (H)  1.7 - 7.7 K/uL Final   Lymphocytes Relative 08/09/2022 16  % Final   Lymphs Abs 08/09/2022 2.7  0.7 - 4.0 K/uL Final   Monocytes Relative 08/09/2022 4  % Final   Monocytes Absolute 08/09/2022 0.7  0.1 - 1.0 K/uL Final   Eosinophils Relative 08/09/2022 3  % Final   Eosinophils Absolute 08/09/2022 0.4  0.0 - 0.5 K/uL Final   Basophils Relative 08/09/2022  1  % Final   Basophils Absolute 08/09/2022 0.1  0.0 - 0.1 K/uL Final   Immature Granulocytes 08/09/2022 1  % Final   Abs Immature Granulocytes 08/09/2022 0.11 (H)  0.00 - 0.07 K/uL Final   Performed at [facility], [place] [address] [place], [place] [address]  Office Visit on 08/09/2022  Component Date Value Ref Range Status   BCR ABL1 / ABL1 08/09/2022 See Scanned report in [place]   Final   Performed at [facility], [address], [place], [place] [address]    RADIOGRAPHIC STUDIES: I have personally reviewed the radiological images as listed and agree with the findings in the report  No results found.  ASSESSMENT/PLAN 76 y.o. female with medical history notable for breast cancer, diabetes mellitus type 2, hyperlipidemia, sleep apnea treated with CPAP, hypertension, cervical polyp, cataracts.  Patient seen regarding history of persistent granulocytosis.    Granulocytosis:  Most likely causes in this patient are DM type II, obesity, nasal steroids (which do have systemic absorption) and gout.  No evidence of a malignant process by history and no evidence of myeloproliferative neoplasm by laboratory testing.    DM Type II:  Now on Ozempic 0.25 mg sq weekly for management.    Follow up PRN     Cancer Staging  No matching staging information was found for the patient.   No problem-specific Assessment & Plan notes found for this encounter.   No orders of the defined types were placed in this encounter.   All questions were answered. The patient knows to call the clinic with any problems, questions or concerns.  This note was electronically signed.    Barbee Cough, MD  08/29/2022 12:44 PM

## 2022-08-30 ENCOUNTER — Inpatient Hospital Stay (HOSPITAL_BASED_OUTPATIENT_CLINIC_OR_DEPARTMENT_OTHER): Payer: Medicare Other | Admitting: Oncology

## 2022-08-30 ENCOUNTER — Telehealth: Payer: Self-pay

## 2022-08-30 ENCOUNTER — Other Ambulatory Visit: Payer: Self-pay

## 2022-08-30 VITALS — BP 146/66 | HR 79 | Temp 97.9°F | Resp 16 | Wt 268.1 lb

## 2022-08-30 DIAGNOSIS — E1165 Type 2 diabetes mellitus with hyperglycemia: Secondary | ICD-10-CM | POA: Diagnosis not present

## 2022-08-30 DIAGNOSIS — D72829 Elevated white blood cell count, unspecified: Secondary | ICD-10-CM | POA: Diagnosis not present

## 2022-08-30 DIAGNOSIS — Z7189 Other specified counseling: Secondary | ICD-10-CM | POA: Diagnosis not present

## 2022-08-30 DIAGNOSIS — E6609 Other obesity due to excess calories: Secondary | ICD-10-CM | POA: Diagnosis not present

## 2022-08-30 DIAGNOSIS — D72828 Other elevated white blood cell count: Secondary | ICD-10-CM | POA: Diagnosis not present

## 2022-08-30 NOTE — Telephone Encounter (Signed)
This nurse reached out to patient related to missed appointment for today at 930 am.  Left a message stating that scheduling will reach out to her to reschedule.  Patient knows to call clinic if there are any other questions or concerns.

## 2022-09-12 DIAGNOSIS — Z7189 Other specified counseling: Secondary | ICD-10-CM | POA: Insufficient documentation

## 2023-08-23 ENCOUNTER — Other Ambulatory Visit: Payer: Self-pay

## 2023-08-23 ENCOUNTER — Emergency Department (HOSPITAL_COMMUNITY): Payer: Medicare Other

## 2023-08-23 ENCOUNTER — Inpatient Hospital Stay (HOSPITAL_COMMUNITY)
Admission: EM | Admit: 2023-08-23 | Discharge: 2023-08-25 | DRG: 312 | Disposition: A | Discharge status: Home / Self Care | Payer: Medicare Other | Attending: Obstetrics and Gynecology | Admitting: Obstetrics and Gynecology

## 2023-08-23 ENCOUNTER — Encounter (HOSPITAL_COMMUNITY): Payer: Self-pay

## 2023-08-23 DIAGNOSIS — N390 Urinary tract infection, site not specified: Secondary | ICD-10-CM | POA: Insufficient documentation

## 2023-08-23 DIAGNOSIS — I951 Orthostatic hypotension: Principal | ICD-10-CM | POA: Insufficient documentation

## 2023-08-23 DIAGNOSIS — E785 Hyperlipidemia, unspecified: Secondary | ICD-10-CM | POA: Diagnosis present

## 2023-08-23 DIAGNOSIS — R296 Repeated falls: Principal | ICD-10-CM

## 2023-08-23 DIAGNOSIS — G4730 Sleep apnea, unspecified: Secondary | ICD-10-CM | POA: Diagnosis present

## 2023-08-23 DIAGNOSIS — I10 Essential (primary) hypertension: Secondary | ICD-10-CM | POA: Diagnosis present

## 2023-08-23 DIAGNOSIS — Z79899 Other long term (current) drug therapy: Secondary | ICD-10-CM

## 2023-08-23 DIAGNOSIS — F32A Depression, unspecified: Secondary | ICD-10-CM | POA: Diagnosis present

## 2023-08-23 DIAGNOSIS — G8929 Other chronic pain: Secondary | ICD-10-CM | POA: Diagnosis present

## 2023-08-23 DIAGNOSIS — R531 Weakness: Secondary | ICD-10-CM

## 2023-08-23 DIAGNOSIS — E119 Type 2 diabetes mellitus without complications: Secondary | ICD-10-CM

## 2023-08-23 DIAGNOSIS — Z981 Arthrodesis status: Secondary | ICD-10-CM

## 2023-08-23 DIAGNOSIS — F0390 Unspecified dementia without behavioral disturbance: Secondary | ICD-10-CM | POA: Insufficient documentation

## 2023-08-23 DIAGNOSIS — Z9181 History of falling: Secondary | ICD-10-CM | POA: Diagnosis not present

## 2023-08-23 DIAGNOSIS — Z7952 Long term (current) use of systemic steroids: Secondary | ICD-10-CM | POA: Diagnosis not present

## 2023-08-23 DIAGNOSIS — F419 Anxiety disorder, unspecified: Secondary | ICD-10-CM | POA: Diagnosis present

## 2023-08-23 DIAGNOSIS — F0393 Unspecified dementia, unspecified severity, with mood disturbance: Secondary | ICD-10-CM | POA: Diagnosis present

## 2023-08-23 DIAGNOSIS — E669 Obesity, unspecified: Secondary | ICD-10-CM | POA: Diagnosis present

## 2023-08-23 DIAGNOSIS — E1140 Type 2 diabetes mellitus with diabetic neuropathy, unspecified: Secondary | ICD-10-CM | POA: Diagnosis present

## 2023-08-23 DIAGNOSIS — S50311A Abrasion of right elbow, initial encounter: Secondary | ICD-10-CM | POA: Diagnosis present

## 2023-08-23 DIAGNOSIS — R350 Frequency of micturition: Secondary | ICD-10-CM | POA: Diagnosis present

## 2023-08-23 DIAGNOSIS — Z7985 Long-term (current) use of injectable non-insulin antidiabetic drugs: Secondary | ICD-10-CM

## 2023-08-23 DIAGNOSIS — Z6841 Body Mass Index (BMI) 40.0 and over, adult: Secondary | ICD-10-CM | POA: Diagnosis not present

## 2023-08-23 DIAGNOSIS — W1830XA Fall on same level, unspecified, initial encounter: Secondary | ICD-10-CM | POA: Diagnosis present

## 2023-08-23 DIAGNOSIS — M1A9XX0 Chronic gout, unspecified, without tophus (tophi): Secondary | ICD-10-CM | POA: Diagnosis present

## 2023-08-23 DIAGNOSIS — Z853 Personal history of malignant neoplasm of breast: Secondary | ICD-10-CM | POA: Diagnosis not present

## 2023-08-23 DIAGNOSIS — S2242XA Multiple fractures of ribs, left side, initial encounter for closed fracture: Secondary | ICD-10-CM | POA: Diagnosis present

## 2023-08-23 DIAGNOSIS — Z7984 Long term (current) use of oral hypoglycemic drugs: Secondary | ICD-10-CM

## 2023-08-23 DIAGNOSIS — E860 Dehydration: Secondary | ICD-10-CM | POA: Diagnosis present

## 2023-08-23 HISTORY — DX: Unspecified dementia, unspecified severity, without behavioral disturbance, psychotic disturbance, mood disturbance, and anxiety: F03.90

## 2023-08-23 LAB — COMPREHENSIVE METABOLIC PANEL
ALT: 17 U/L (ref 0–44)
AST: 19 U/L (ref 15–41)
Albumin: 3.7 g/dL (ref 3.5–5.0)
Alkaline Phosphatase: 62 U/L (ref 38–126)
Anion gap: 16 — ABNORMAL HIGH (ref 5–15)
BUN: 14 mg/dL (ref 8–23)
CO2: 22 mmol/L (ref 22–32)
Calcium: 9 mg/dL (ref 8.9–10.3)
Chloride: 100 mmol/L (ref 98–111)
Creatinine, Ser: 1.1 mg/dL — ABNORMAL HIGH (ref 0.44–1.00)
GFR, Estimated: 53 mL/min — ABNORMAL LOW (ref >60)
Glucose, Bld: 207 mg/dL — ABNORMAL HIGH (ref 70–99)
Potassium: 3.8 mmol/L (ref 3.5–5.1)
Sodium: 138 mmol/L (ref 135–145)
Total Bilirubin: 1.2 mg/dL (ref 0.3–1.2)
Total Protein: 6.3 g/dL — ABNORMAL LOW (ref 6.5–8.1)

## 2023-08-23 LAB — CBC
HCT: 41.9 % (ref 36.0–46.0)
HCT: 42.6 % (ref 36.0–46.0)
Hemoglobin: 13.7 g/dL (ref 12.0–15.0)
Hemoglobin: 14.1 g/dL (ref 12.0–15.0)
MCH: 29.5 pg (ref 26.0–34.0)
MCH: 29.7 pg (ref 26.0–34.0)
MCHC: 32.7 g/dL (ref 30.0–36.0)
MCHC: 33.1 g/dL (ref 30.0–36.0)
MCV: 90.1 fL (ref 80.0–100.0)
MCV: 89.7 fL (ref 80.0–100.0)
Platelets: 253 10*3/uL (ref 150–400)
Platelets: 285 10*3/uL (ref 150–400)
RBC: 4.65 MIL/uL (ref 3.87–5.11)
RBC: 4.75 MIL/uL (ref 3.87–5.11)
RDW: 13.9 % (ref 11.5–15.5)
RDW: 13.9 % (ref 11.5–15.5)
WBC: 17 10*3/uL — ABNORMAL HIGH (ref 4.0–10.5)
WBC: 15.3 10*3/uL — ABNORMAL HIGH (ref 4.0–10.5)
nRBC: 0 % (ref 0.0–0.2)
nRBC: 0 % (ref 0.0–0.2)

## 2023-08-23 LAB — DIFFERENTIAL
Abs Immature Granulocytes: 0.07 10*3/uL (ref 0.00–0.07)
Basophils Absolute: 0.1 10*3/uL (ref 0.0–0.1)
Basophils Relative: 1 %
Eosinophils Absolute: 0.2 10*3/uL (ref 0.0–0.5)
Eosinophils Relative: 1 %
Immature Granulocytes: 0 %
Lymphocytes Relative: 12 %
Lymphs Abs: 2 10*3/uL (ref 0.7–4.0)
Monocytes Absolute: 1 10*3/uL (ref 0.1–1.0)
Monocytes Relative: 6 %
Neutro Abs: 13.7 10*3/uL — ABNORMAL HIGH (ref 1.7–7.7)
Neutrophils Relative %: 80 %

## 2023-08-23 LAB — APTT: aPTT: 25 s (ref 24–36)

## 2023-08-23 LAB — I-STAT CHEM 8, ED
BUN: 16 mg/dL (ref 8–23)
Calcium, Ion: 1.06 mmol/L — ABNORMAL LOW (ref 1.15–1.40)
Chloride: 103 mmol/L (ref 98–111)
Creatinine, Ser: 1.1 mg/dL — ABNORMAL HIGH (ref 0.44–1.00)
Glucose, Bld: 200 mg/dL — ABNORMAL HIGH (ref 70–99)
HCT: 43 % (ref 36.0–46.0)
Hemoglobin: 14.6 g/dL (ref 12.0–15.0)
Potassium: 3.7 mmol/L (ref 3.5–5.1)
Sodium: 138 mmol/L (ref 135–145)
TCO2: 21 mmol/L — ABNORMAL LOW (ref 22–32)

## 2023-08-23 LAB — AMMONIA: Ammonia: 19 umol/L (ref 9–35)

## 2023-08-23 LAB — GLUCOSE, CAPILLARY: Glucose-Capillary: 123 mg/dL — ABNORMAL HIGH (ref 70–99)

## 2023-08-23 LAB — CREATININE, SERUM
Creatinine, Ser: 1.02 mg/dL — ABNORMAL HIGH (ref 0.44–1.00)
GFR, Estimated: 58 mL/min — ABNORMAL LOW (ref >60)

## 2023-08-23 LAB — CBG MONITORING, ED: Glucose-Capillary: 99 mg/dL (ref 70–99)

## 2023-08-23 LAB — PROTIME-INR
INR: 1 (ref 0.8–1.2)
Prothrombin Time: 13.8 s (ref 11.4–15.2)

## 2023-08-23 LAB — ETHANOL: Alcohol, Ethyl (B): <10 mg/dL (ref <10)

## 2023-08-23 MED ORDER — INSULIN ASPART 100 UNIT/ML IJ SOLN: 0.0000 [IU] | Freq: Three times a day (TID) | INTRAMUSCULAR | Status: DC

## 2023-08-23 MED ORDER — TRAMADOL HCL 50 MG PO TABS
50.0000 mg | ORAL_TABLET | Freq: Four times a day (QID) | ORAL | Status: AC | PRN
Start: 2023-08-23 — End: 2023-08-24
  Administered 2023-08-23 – 2023-08-24 (×2): 50 mg via ORAL
  Filled 2023-08-23 (×2): qty 1

## 2023-08-23 MED ORDER — ALLOPURINOL 100 MG PO TABS
100.0000 mg | ORAL_TABLET | Freq: Every day | ORAL | Status: DC
  Administered 2023-08-23 – 2023-08-24 (×2): 100 mg via ORAL
  Filled 2023-08-23 (×3): qty 1

## 2023-08-23 MED ORDER — ENOXAPARIN SODIUM 60 MG/0.6ML IJ SOSY
60.0000 mg | PREFILLED_SYRINGE | INTRAMUSCULAR | Status: DC
  Administered 2023-08-23 – 2023-08-24 (×2): 60 mg via SUBCUTANEOUS
  Filled 2023-08-23 (×2): qty 0.6

## 2023-08-23 MED ORDER — PRAVASTATIN SODIUM 40 MG PO TABS
40.0000 mg | ORAL_TABLET | Freq: Every day | ORAL | Status: DC
  Administered 2023-08-24 – 2023-08-25 (×2): 40 mg via ORAL
  Filled 2023-08-23 (×2): qty 1

## 2023-08-23 MED ORDER — FESOTERODINE FUMARATE ER 4 MG PO TB24: 4.0000 mg | ORAL_TABLET | Freq: Every day | ORAL | Status: DC

## 2023-08-23 MED ORDER — SODIUM CHLORIDE 0.9% FLUSH: 3.0000 mL | Freq: Once | INTRAVENOUS | Status: DC

## 2023-08-23 MED ORDER — COLCHICINE 0.6 MG PO TABS
1.2000 mg | ORAL_TABLET | Freq: Two times a day (BID) | ORAL | Status: DC
  Filled 2023-08-23 (×5): qty 2

## 2023-08-23 MED ORDER — ONDANSETRON HCL 4 MG/2ML IJ SOLN: 4.0000 mg | Freq: Four times a day (QID) | INTRAMUSCULAR | Status: DC | PRN

## 2023-08-23 MED ORDER — LACTATED RINGERS IV SOLN: INTRAVENOUS | Status: AC

## 2023-08-23 MED ORDER — ONDANSETRON HCL 4 MG PO TABS: 4.0000 mg | ORAL_TABLET | Freq: Four times a day (QID) | ORAL | Status: DC | PRN

## 2023-08-23 MED ORDER — GABAPENTIN 300 MG PO CAPS
300.0000 mg | ORAL_CAPSULE | Freq: Three times a day (TID) | ORAL | Status: DC
  Administered 2023-08-23 – 2023-08-24 (×2): 300 mg via ORAL
  Filled 2023-08-23 (×3): qty 1

## 2023-08-23 MED ORDER — FOSFOMYCIN TROMETHAMINE 3 G PO PACK
3.0000 g | PACK | Freq: Once | ORAL | Status: AC
  Administered 2023-08-23: 3 g via ORAL
  Filled 2023-08-23: qty 3

## 2023-08-23 NOTE — ED Provider Triage Note (Signed)
Emergency Medicine Provider Triage Evaluation Note  Lateefah Napoletano , a 77 y.o. female  was evaluated in triage.  Pt complains of 5 falls in the last 32 hours. Lost 100 lbs in the last 3 months. Recently dx'd with dementia. Dark urine. Unable to stand, increased generalized weakness.   Review of Systems  Positive: weakness Negative: headache  Physical Exam  BP 117/67 (BP Location: Right Arm)   Pulse (!) 101   Temp 97.7 F (36.5 C)   Resp 17   Ht 5\' 6"  (1.676 m)   Wt 121.6 kg   SpO2 99%   BMI 43.27 kg/m  Gen:   Awake, no distress   Resp:  Normal effort  MSK:   Moves extremities without difficulty  Other:  Left rib cage ttp, 5/5 strength of bilateral upper and lower extremities  Medical Decision Making  Medically screening exam initiated at 11:32 AM.  Appropriate orders placed.  Savonne Winebrenner was informed that the remainder of the evaluation will be completed by another provider, this initial triage assessment does not replace that evaluation, and the importance of remaining in the ED until their evaluation is complete.    Pete Pelt, [place] 08/23/23 1137

## 2023-08-23 NOTE — ED Provider Notes (Signed)
[place] EMERGENCY DEPARTMENT AT [facility] Provider Note   CSN: [id number] Arrival date & time: 08/23/23  1059     History  Chief Complaint  Patient presents with   Fall   Weakness    Brandi [name] is a 77 y.o. female.  Patient is a 77 year old female with a past medical history of dementia, hypertension, diabetes presenting to the emergency department for frequent falls.  The patient is here with her daughter-in-law/POA who reports over the last 36 hours she is at approximately 5 falls.  The patient has been falling trying to get out of bed and walking around the house.  She is unsure if she hit her head or lost consciousness.  She states that she has been having left-sided rib pain and pain with taking deep breaths but denies feeling short of breath.  She states that she has been very dizzy with position changes and feels off balance.  She denies any room spinning dizziness.  She denies any numbness.  She reports she feels generally weak all over.  She denies any fevers.  She states that she does have urinary frequency.  She was seen by her primary doctor last week but was unable to provide a urine sample at that time.  The history is provided by the patient and a caregiver.  Fall  Weakness      Home Medications Prior to Admission medications   Medication Sig Start Date End Date Taking? Authorizing Provider  allopurinol (ZYLOPRIM) 100 MG tablet Take 1 tablet by mouth daily. 08/21/23  Yes [provider]  amitriptyline (ELAVIL) 100 MG tablet Take 100 mg by mouth daily.   Yes [provider]  amitriptyline (ELAVIL) 50 MG tablet Take 50 mg by mouth at bedtime.   Yes [provider]  azelastine (ASTELIN) 0.1 % nasal spray Place 1 spray into both nostrils daily. 07/20/22  Yes [provider]  cephALEXin (KEFLEX) 250 MG capsule Take 1 tablet by mouth daily.   Yes [provider]  citalopram (CELEXA) 20 MG tablet Take 20 mg by  mouth daily.   Yes [provider]  citalopram (CELEXA) 40 MG tablet Take 40 mg by mouth daily.   Yes [provider]  colchicine 0.6 MG tablet Take 1.2 mg by mouth 2 (two) times daily. Take 2 tablets by mouth immediately then take 1 tablet 1 hour later   Yes [provider]  doxycycline (MONODOX) 100 MG capsule Take 100 mg by mouth 2 (two) times daily.   Yes [provider]  fluticasone (FLONASE) 50 MCG/ACT nasal spray Place 1 spray into both nostrils daily. 07/20/22  Yes [provider]  gabapentin (NEURONTIN) 300 MG capsule Take 300 mg by mouth 3 (three) times daily.   Yes [provider]  glipiZIDE (GLUCOTROL) 10 MG tablet Take 15 mg by mouth 2 (two) times daily before a meal.   Yes [provider]  hydrochlorothiazide (HYDRODIURIL) 25 MG tablet Take 25 mg by mouth daily.   Yes [provider]  HYDROcodone-acetaminophen (NORCO/VICODIN) 5-325 MG per tablet Take 0.5-1 tablets by mouth every 4 (four) hours as needed for moderate pain (pain score 4-6) or severe pain (pain score 7-10) (foot pain).   Yes [provider]  lisinopril (PRINIVIL,ZESTRIL) 10 MG tablet Take 10 mg by mouth daily. Takes 1/2 = 5mg    Yes [provider]  lisinopril (ZESTRIL) 40 MG tablet Take 40 mg by mouth daily.   Yes [provider]  metFORMIN (GLUCOPHAGE) 1000 MG tablet Take 1,000 mg by mouth 2 (two) times daily with a meal.   Yes [provider]  montelukast (SINGULAIR) 10 MG tablet Take 10 mg by mouth at bedtime.   Yes [provider]  moxifloxacin (VIGAMOX) 0.5 % ophthalmic solution Place 1 drop into both eyes 3 (three) times daily.   Yes [provider]  MYRBETRIQ 50 MG TB24 tablet Take 50 mg by mouth daily. 06/09/22  Yes [provider]  nystatin powder Apply 1 Application topically See admin instructions. Apply to affected areas under the breast one to two times daily.   Yes [provider]  oxybutynin (DITROPAN-XL) 10 MG 24 hr tablet Take 10 mg by mouth at bedtime.   Yes [provider]  oxyCODONE-acetaminophen (ROXICET) 5-325 MG tablet Take 1-2 tablets by mouth every 6 (six) hours as needed for severe pain. Patient taking differently: Take 1 tablet by mouth every 6 (six) hours as needed for severe pain (pain score 7-10) or moderate pain (pain score 4-6). 07/01/16  Yes Mack Hook, MD  OZEMPIC, 0.25 OR 0.5 MG/DOSE, 2 MG/3ML SOPN Inject 0.5 mg into the skin once a week. 07/20/22  Yes [provider]  penicillin v potassium (VEETID) 500 MG tablet Take 500 mg by mouth 4 (four) times daily.   Yes [provider]  potassium chloride (K-DUR,KLOR-CON) 10 MEQ tablet Take 10 mEq by mouth daily.   Yes [provider]  potassium chloride (KLOR-CON) 10 MEQ tablet Take 10 mEq by mouth daily.   Yes [provider]  pravastatin (PRAVACHOL) 20 MG tablet Take 20 mg by mouth daily.   Yes [provider]  pravastatin (PRAVACHOL) 40 MG tablet Take 40 mg by mouth daily.   Yes [provider]  predniSONE (DELTASONE) 20 MG tablet Take 40 mg by mouth daily with breakfast.   Yes [provider]  solifenacin (VESICARE) 10 MG tablet Take 1 tablet by mouth daily.   Yes [provider]  traMADol (ULTRAM) 50 MG tablet Take 100 mg by mouth every 6 (six) hours as needed for moderate pain (pain score 4-6) or severe pain (pain score 7-10). 03/07/22  Yes [provider]  triamcinolone cream (KENALOG) 0.1 % Apply 1 Application topically 2 (two) times daily.   Yes [provider]  UNABLE TO FIND Place 10-15 cm into the nose. Horizon Nasal CPAP System: auto 10 - 15 cm H2O w/humidification   Yes [provider]  Continuous Blood Gluc Sensor (FREESTYLE LIBRE 3 SENSOR) MISC daily. 04/29/22   [provider]      Allergies    Patient has no known allergies.    Review of Systems   Review of  Systems  Neurological:  Positive for weakness.    Physical Exam Updated Vital Signs BP (!) 150/90 (BP Location: Left Arm)   Pulse 84   Temp 97.7 F (36.5 C) (Oral)   Resp 20   Ht 5\' 6"  (1.676 m)   Wt 121.6 kg   SpO2 99%   BMI 43.27 kg/m  Physical Exam Vitals and nursing note reviewed.  Constitutional:      General: She is not in acute distress.    Appearance: Normal appearance.  HENT:     Head: Normocephalic and atraumatic.     Nose: Nose normal.     Mouth/Throat:     Mouth: Mucous membranes are moist.     Pharynx: Oropharynx is clear.  Eyes:  Extraocular Movements: Extraocular movements intact.     Conjunctiva/sclera: Conjunctivae normal.  Neck:     Comments: No midline neck tenderness Cardiovascular:     Rate and Rhythm: Normal rate and regular rhythm.     Heart sounds: Normal heart sounds.  Pulmonary:     Effort: Pulmonary effort is normal.     Breath sounds: Normal breath sounds.  Abdominal:     General: Abdomen is flat.     Palpations: Abdomen is soft.     Tenderness: There is no abdominal tenderness.  Musculoskeletal:        General: Normal range of motion.     Cervical back: Normal range of motion and neck supple.     Comments: No midline back tenderness Left-sided rib tenderness to palpation Pelvis stable, nontender No bony tenderness of bilateral upper or lower extremities  Skin:    General: Skin is warm and dry.     Comments: Healing appearing abrasion to right elbow  Neurological:     General: No focal deficit present.     Mental Status: She is alert and oriented to person, place, and time.     Cranial Nerves: No cranial nerve deficit.     Sensory: No sensory deficit.     Motor: No weakness.     Coordination: Coordination normal.  Psychiatric:        Mood and Affect: Mood normal.        Behavior: Behavior normal.     ED Results / Procedures / Treatments   Labs (all labs ordered are listed, but only abnormal results are displayed) Labs  Reviewed  CBC - Abnormal; Notable for the following components:      Result Value   WBC 17.0 (*)    All other components within normal limits  DIFFERENTIAL - Abnormal; Notable for the following components:   Neutro Abs 13.7 (*)    All other components within normal limits  COMPREHENSIVE METABOLIC PANEL - Abnormal; Notable for the following components:   Glucose, Bld 207 (*)    Creatinine, Ser 1.10 (*)    Total Protein 6.3 (*)    GFR, Estimated 53 (*)    Anion gap 16 (*)    All other components within normal limits  I-STAT CHEM 8, ED - Abnormal; Notable for the following components:   Creatinine, Ser 1.10 (*)    Glucose, Bld 200 (*)    Calcium, Ion 1.06 (*)    TCO2 21 (*)    All other components within normal limits  PROTIME-INR  APTT  ETHANOL  AMMONIA  URINALYSIS, ROUTINE W REFLEX MICROSCOPIC  CBG MONITORING, ED    EKG EKG Interpretation Date/Time:  Saturday August 23 2023 11:16:30 EDT Ventricular Rate:  103 PR Interval:  164 QRS Duration:  88 QT Interval:  376 QTC Calculation: 492 R Axis:   -16  Text Interpretation: Sinus tachycardia with occasional Premature ventricular complexes Otherwise normal ECG No significant change since last tracing Confirmed by Elayne Snare ([id number]) on 08/23/2023 2:04:34 PM  Radiology DG Ribs Unilateral W/Chest Left  Result Date: 08/23/2023 CLINICAL DATA:  77 year old female with history of trauma from a fall complaining of left-sided rib pain. EXAM: LEFT RIBS AND CHEST - 3+ VIEW COMPARISON:  No priors. FINDINGS: Minimally displaced fractures of the lateral left ninth rib and anterior left sixth rib are noted. Lung volumes are low. No consolidative airspace disease. No pleural effusions. No pneumothorax. No pulmonary nodule or mass noted. Pulmonary vasculature and the cardiomediastinal silhouette are  within normal limits. Atherosclerosis in the thoracic aorta. Orthopedic fixation hardware in the left proximal humerus partially imaged with  advanced degenerative changes in the left glenohumeral joint likely related to remote trauma. IMPRESSION: 1. Minimally displaced fractures of the left ninth and sixth ribs, as above. 2. Low lung volumes.  No pneumothorax or other acute findings. 3. Aortic atherosclerosis. Electronically Signed   By: Trudie Reed M.D.   On: 08/23/2023 13:07   CT HEAD WO CONTRAST  Result Date: 08/23/2023 CLINICAL DATA:  Acute stroke suspected.  Neck trauma EXAM: CT HEAD WITHOUT CONTRAST CT CERVICAL SPINE WITHOUT CONTRAST TECHNIQUE: Multidetector CT imaging of the head and cervical spine was performed following the standard protocol without intravenous contrast. Multiplanar CT image reconstructions of the cervical spine were also generated. RADIATION DOSE REDUCTION: This exam was performed according to the departmental dose-optimization program which includes automated exposure control, adjustment of the mA and/or kV according to patient size and/or use of iterative reconstruction technique. COMPARISON:  None Available. FINDINGS: CT HEAD FINDINGS Brain: No evidence of acute infarction, hemorrhage, hydrocephalus, extra-axial collection or mass lesion/mass effect. Chronic small vessel ischemic gliosis in the cerebral white matter. Mild cerebral volume loss for age. Vascular: No hyperdense vessel or unexpected calcification. Skull: Normal. Negative for fracture or focal lesion. Sinuses/Orbits: No acute finding. CT CERVICAL SPINE FINDINGS Alignment: No traumatic malalignment. Mild degenerative anterolisthesis at C3-4 to C5-6. Skull base and vertebrae: No acute fracture. No primary bone lesion or focal pathologic process. Soft tissues and spinal canal: No prevertebral fluid or swelling. No visible canal hematoma. Disc levels:  Generalized bulky degenerative facet spurring. Upper chest: No evidence of injury IMPRESSION: No evidence of acute intracranial or cervical spine injury. No acute intracranial finding. Electronically Signed    By: Tiburcio Pea M.D.   On: 08/23/2023 12:46   CT Cervical Spine Wo Contrast  Result Date: 08/23/2023 CLINICAL DATA:  Acute stroke suspected.  Neck trauma EXAM: CT HEAD WITHOUT CONTRAST CT CERVICAL SPINE WITHOUT CONTRAST TECHNIQUE: Multidetector CT imaging of the head and cervical spine was performed following the standard protocol without intravenous contrast. Multiplanar CT image reconstructions of the cervical spine were also generated. RADIATION DOSE REDUCTION: This exam was performed according to the departmental dose-optimization program which includes automated exposure control, adjustment of the mA and/or kV according to patient size and/or use of iterative reconstruction technique. COMPARISON:  None Available. FINDINGS: CT HEAD FINDINGS Brain: No evidence of acute infarction, hemorrhage, hydrocephalus, extra-axial collection or mass lesion/mass effect. Chronic small vessel ischemic gliosis in the cerebral white matter. Mild cerebral volume loss for age. Vascular: No hyperdense vessel or unexpected calcification. Skull: Normal. Negative for fracture or focal lesion. Sinuses/Orbits: No acute finding. CT CERVICAL SPINE FINDINGS Alignment: No traumatic malalignment. Mild degenerative anterolisthesis at C3-4 to C5-6. Skull base and vertebrae: No acute fracture. No primary bone lesion or focal pathologic process. Soft tissues and spinal canal: No prevertebral fluid or swelling. No visible canal hematoma. Disc levels:  Generalized bulky degenerative facet spurring. Upper chest: No evidence of injury IMPRESSION: No evidence of acute intracranial or cervical spine injury. No acute intracranial finding. Electronically Signed   By: Tiburcio Pea M.D.   On: 08/23/2023 12:46    Procedures Procedures    Medications Ordered in ED Medications  sodium chloride flush (NS) 0.9 % injection 3 mL (has no administration in time range)    ED Course/ Medical Decision Making/ A&P Clinical Course as of 08/23/23  1559  Sat Aug 23, 2023  1508 Drop of BP >20 from lying to sitting, making orthostatics likely cause of dizziness. UA pending at this time. Will plan for admission for frequent falls and suspect UTI. [VK]    Clinical Course User Index [VK] Rexford Maus, DO                                 Medical Decision Making This patient presents to the ED with chief complaint(s) of dizziness, frequent falls with pertinent past medical history of dementia, hypertension, diabetes which further complicates the presenting complaint. The complaint involves an extensive differential diagnosis and also carries with it a high risk of complications and morbidity.    The differential diagnosis includes ICH, mass effect, cervical spine fracture, no focal neurologic deficits making CVA or TIA less likely, orthostatic hypotension, dehydration, electrolyte abnormality, infection  Additional history obtained: Additional history obtained from family Records reviewed Care Everywhere/External Records  ED Course and Reassessment: On patient's arrival she is hemodynamically stable in no acute distress.  Initially evaluated by provider in triage and had EKG, labs and CT head/C-spine as well as chest and rib x-ray performed.  Workup showed a left sixth and ninth rib fracture, no other traumatic injuries.  Labs showed a leukocytosis with a mildly elevated anion gap.  Urine is pending at this time.  No neurologic deficits on exam to suggest TIA or CVA.  Will have orthostatic vitals.  Patient agreeable for In-N-Out cath.  Independent labs interpretation:  The following labs were independently interpreted: leukocytosis, mildly increased AGAP  Independent visualization of imaging: - I independently visualized the following imaging with scope of interpretation limited to determining acute life threatening conditions related to emergency care: CTH/C-spine, CXR/L rib XR, which revealed L 6th and 9th rib fractures, no other  abnormality  Consultation: - Consulted or discussed management/test interpretation w/ external professional: hospitalist  Consideration for admission or further workup: patient requires admission for fractures and orthostatic hypotension Social Determinants of health: N/A    Amount and/or Complexity of Data Reviewed Labs: ordered. Radiology: ordered.  Risk Decision regarding hospitalization.          Final Clinical Impression(s) / ED Diagnoses Final diagnoses:  Frequent falls  Generalized weakness  Closed fracture of multiple ribs of left side, initial encounter  Orthostatic hypotension    Rx / DC Orders ED Discharge Orders     None         Rexford Maus, DO 08/23/23 1559

## 2023-08-23 NOTE — H&P (Signed)
History and Physical    Brandi [name] IHK:[id number] DOB: [birth year] DOA: 08/23/2023  PCP: Ardath Sax, FNP   Chief Complaint: Falls, weakness  HPI: Brandi [name] is a 77 y.o. female with medical history significant of dementia, depression, hypertension, hyperlipidemia, breast cancer presents emergency department with frequent falls.  Over the last 3 days she is fallen at least 5 times.  She states anytime she stands up or moves her head she feels dizzy and falls down.  She denies tripping on any items.  The last time she fell she developed right-sided rib pain and pain with breathing so she presented to the ER for further assessment.  Family was concerned about her wellbeing at home.  She was reportedly endorsing dysuria and frequency.  On arrival to the ER she was afebrile and hemodynamically stable.  Labs were obtained which revealed INR 1.0, WBC 17, hemoglobin 13.7, creatinine 1.1, chest x-ray demonstrated evidence of left-sided rib fractures.  CT head showed no acute intracranial abnormalities.  CT spine showed no evidence of spine fracture.  Patient was admitted for further workup.  On admission she was resting comfortably.  She states she has had poor p.o. intake since starting Ozempic and endorses dehydration.  She denies any suprapubic tenderness however has had urinary frequency.  She denies any fever or chills.   Review of Systems: Review of Systems  Constitutional:  Negative for chills and fever.  HENT: Negative.    Respiratory: Negative.  Negative for shortness of breath.   Cardiovascular: Negative.   Gastrointestinal: Negative.   Genitourinary: Negative.   Musculoskeletal: Negative.   Skin:  Negative for itching and rash.  Neurological:  Positive for dizziness.  Endo/Heme/Allergies: Negative.   Psychiatric/Behavioral: Negative.    All other systems reviewed and are negative.    As per HPI otherwise 10 point review of systems negative.   No Known Allergies  Past  Medical History:  Diagnosis Date   Anxiety    Arthritis    Cancer (HCC)    breast   Dementia (HCC)    Depression    Diabetes mellitus without complication (HCC)    Hyperlipemia    Hypertension    PONV (postoperative nausea and vomiting)    Sleep apnea    uses a cpap    Past Surgical History:  Procedure Laterality Date   APPENDECTOMY     ARTERY EXPLORATION  08/25/2012   Procedure: ARTERY EXPLORATION;  Surgeon: Nicki Reaper, MD;  Location: [facility];  Service: Orthopedics;  Laterality: Right;  Application External Fixator Exploration Digital Artery Right Little Finger/Exploration Artery and Nerve Right Ring Finger   BREAST LUMPECTOMY     right   BREAST SURGERY     multiple br bx-   CERVICAL POLYPECTOMY     EXTERNAL FIXATION REMOVAL  10/07/2012   Procedure: REMOVAL EXTERNAL FIXATION ARM;  Surgeon: Nicki Reaper, MD;  Location: [facility];  Service: Orthopedics;  Laterality: Right;   HERNIA REPAIR     abd   LUMBAR LAMINECTOMY  1985   NERVE EXPLORATION  08/25/2012   Procedure: NERVE EXPLORATION;  Surgeon: Nicki Reaper, MD;  Location: [facility];  Service: Orthopedics;  Laterality: Right;   ORIF HUMERUS FRACTURE Left 07/01/2016   Procedure: OPEN TREATMENT OF LEFT  PROXIMAL HUMERUS FRACTURE;  Surgeon: Mack Hook, MD;  Location: [facility];  Service: Orthopedics;  Laterality: Left;  GENERAL ANESTHESIA WITH PRE-OP BLOCK   PROXIMAL INTERPHALANGEAL FUSION (PIP)  10/07/2012   Procedure: PROXIMAL INTERPHALANGEAL FUSION (PIP);  Surgeon: Nicki Reaper, MD;  Location: [facility];  Service: Orthopedics;  Laterality: Right;  FUSION PIP RIGHT SMALL FINGER, DISTAL RADIUS GRAFT, REMOVAL OF EXTERNAL FIXATOR, SYNTHESE MINI     reports that she has never smoked. She has never used smokeless tobacco. She reports current alcohol use. She reports that she does not use drugs.  History reviewed. No pertinent family  history.  Prior to Admission medications   Medication Sig Start Date End Date Taking? Authorizing Provider  allopurinol (ZYLOPRIM) 100 MG tablet Take 1 tablet by mouth daily as needed (flair up of gout). 08/21/23  Yes [provider]  amitriptyline (ELAVIL) 100 MG tablet Take 100 mg by mouth at bedtime.   Yes [provider]  azelastine (ASTELIN) 0.1 % nasal spray Place 1 spray into both nostrils daily. 07/20/22  Yes [provider]  cephALEXin (KEFLEX) 250 MG capsule Take 1 tablet by mouth daily.   Yes [provider]  gabapentin (NEURONTIN) 300 MG capsule Take 300 mg by mouth See admin instructions. Take 1 tablet by mouth in the morning and then 2 tablets by mouth at night   Yes [provider]  metFORMIN (GLUCOPHAGE) 1000 MG tablet Take 1,000 mg by mouth 2 (two) times daily with a meal.   Yes [provider]  citalopram (CELEXA) 20 MG tablet Take 20 mg by mouth daily.    [provider]  colchicine 0.6 MG tablet Take 1.2 mg by mouth 2 (two) times daily. Take 2 tablets by mouth immediately then take 1 tablet 1 hour later    [provider]  Continuous Blood Gluc Sensor (FREESTYLE LIBRE 3 SENSOR) MISC daily. 04/29/22   [provider]  doxycycline (MONODOX) 100 MG capsule Take 100 mg by mouth 2 (two) times daily.    [provider]  fluticasone (FLONASE) 50 MCG/ACT nasal spray Place 1 spray into both nostrils daily. 07/20/22   [provider]  glipiZIDE (GLUCOTROL) 10 MG tablet Take 15 mg by mouth 2 (two) times daily before a meal.    [provider]  hydrochlorothiazide (HYDRODIURIL) 25 MG tablet Take 25 mg by mouth daily.    [provider]  HYDROcodone-acetaminophen (NORCO/VICODIN) 5-325 MG per tablet Take 0.5-1 tablets by mouth every 4 (four) hours as needed for moderate pain (pain score 4-6) or severe pain (pain score 7-10) (foot pain).    [provider]  lisinopril  (ZESTRIL) 40 MG tablet Take 40 mg by mouth daily.    [provider]  montelukast (SINGULAIR) 10 MG tablet Take 10 mg by mouth at bedtime.    [provider]  moxifloxacin (VIGAMOX) 0.5 % ophthalmic solution Place 1 drop into both eyes 3 (three) times daily.    [provider]  nystatin powder Apply 1 Application topically See admin instructions. Apply to affected areas under the breast one to two times daily.    [provider]  oxybutynin (DITROPAN-XL) 10 MG 24 hr tablet Take 10 mg by mouth at bedtime.    [provider]  oxyCODONE-acetaminophen (ROXICET) 5-325 MG tablet Take 1-2 tablets by mouth every 6 (six) hours as needed for severe pain. Patient taking differently: Take 1 tablet by mouth every 6 (six) hours as needed for severe pain (pain score 7-10) or moderate pain (pain score 4-6). 07/01/16   Mack Hook, MD  OZEMPIC, 0.25 OR 0.5 MG/DOSE, 2 MG/3ML SOPN Inject 0.5 mg into the  skin once a week. 07/20/22   [provider]  penicillin v potassium (VEETID) 500 MG tablet Take 500 mg by mouth 4 (four) times daily.    [provider]  pravastatin (PRAVACHOL) 40 MG tablet Take 40 mg by mouth daily.    [provider]  predniSONE (DELTASONE) 20 MG tablet Take 40 mg by mouth daily with breakfast.    [provider]  solifenacin (VESICARE) 10 MG tablet Take 1 tablet by mouth daily.    [provider]  traMADol (ULTRAM) 50 MG tablet Take 100 mg by mouth every 6 (six) hours as needed for moderate pain (pain score 4-6) or severe pain (pain score 7-10). 03/07/22   [provider]  triamcinolone cream (KENALOG) 0.1 % Apply 1 Application topically 2 (two) times daily.    [provider]  UNABLE TO FIND Place 10-15 cm into the nose. Horizon Nasal CPAP System: auto 10 - 15 cm H2O w/humidification    [provider]    Physical Exam: Vitals:   08/23/23 1127 08/23/23 1445 08/23/23 1540 08/23/23  1613  BP:  (!) 122/93 (!) 150/90 121/66  Pulse:  92 84 64  Resp:   20 18  Temp:   97.7 F (36.5 C) 97.9 F (36.6 C)  TempSrc:   Oral Oral  SpO2:  99% 99% 100%  Weight: 121.6 kg     Height: 5\' 6"  (1.676 m)      Physical Exam Constitutional:      Appearance: She is normal weight.  HENT:     Head: Normocephalic.     Nose: Nose normal.     Mouth/Throat:     Mouth: Mucous membranes are dry.  Eyes:     Conjunctiva/sclera: Conjunctivae normal.     Pupils: Pupils are equal, round, and reactive to light.  Cardiovascular:     Rate and Rhythm: Normal rate and regular rhythm.     Pulses: Normal pulses.     Heart sounds: Normal heart sounds.  Pulmonary:     Breath sounds: Normal breath sounds.  Abdominal:     General: Abdomen is flat. Bowel sounds are normal.  Musculoskeletal:        General: Normal range of motion.     Cervical back: Normal range of motion and neck supple.  Skin:    General: Skin is warm.     Capillary Refill: Capillary refill takes less than 2 seconds.  Neurological:     General: No focal deficit present.     Mental Status: She is alert. Mental status is at baseline.  Psychiatric:        Mood and Affect: Mood normal.        Labs on Admission: I have personally reviewed the patients's labs and imaging studies.  Assessment/Plan Principal Problem:   Falls frequently   # Falls complicated by rib fracture # Orthostatic hypotension # Syncope likely related to orthostasis - Patient is notably dehydrated and endorses orthostatic syncope with positive orthostatic vitals -5 falls in 3 days  Plan: PT OT IV hydration Patient lives by herself with close supervision of her daughter Hold hydrochlorothiazide  # Hypertension-hold antihypertensives as patient symptomatic.  Will plan to hold hydrochlorothiazide, lisinopril  # Gout-continue allopurinol, colchicine  # Urinary frequency-continue Toviaz  # Chronic pain-continue gabapentin  #  Hyperlipidemia-continue statin  # Urinary tract infection-patient has leukocytosis and urinary frequency and dysuria.  Unable to obtain urine sample.  Will empirically treat given patient's symptoms and falls.  Will give fosfomycin  # Type 2 diabetes-hold home oral antiglycemic's and placed on sliding scale    Admission status: Inpatient Telemetry Medical  Certification: The appropriate patient status for this patient is INPATIENT. Inpatient status is judged to be reasonable and necessary in order to provide the required intensity of service to ensure the patient's safety. The patient's presenting symptoms, physical exam findings, and initial radiographic and laboratory data in the context of their chronic comorbidities is felt to place them at high risk for further clinical deterioration. Furthermore, it is not anticipated that the patient will be medically stable for discharge from the hospital within 2 midnights of admission.   * I certify that at the point of admission it is my clinical judgment that the patient will require inpatient hospital care spanning beyond 2 midnights from the point of admission due to high intensity of service, high risk for further deterioration and high frequency of surveillance required.Alan Mulder MD [facility] If 7PM-7AM, please contact night-coverage [email or web address]  08/23/2023, 4:44 PM

## 2023-08-23 NOTE — ED Triage Notes (Signed)
Per pt's daughter-in-law pt has fell five times in the last 32 hours. Pt has been dizzy when changing positionsx3d. Per pt's daughter-in-law pt has had slurred speechx3d. Pt fell out of bed in the last 32 hours.

## 2023-08-23 NOTE — ED Notes (Signed)
ED TO INPATIENT HANDOFF REPORT  ED Nurse Name and Phone #: Clelia Croft [phone]  S Name/Age/Gender Brandi [name] 77 y.o. female Room/Bed: 038C/038C  Code Status   Code Status: Full Code  Home/SNF/Other Home Patient oriented to: self, place, time, and situation Is this baseline? Yes   Triage Complete: Triage complete  Chief Complaint Falls frequently [R29.6]  Triage Note Per pt's daughter-in-law pt has fell five times in the last 32 hours. Pt has been dizzy when changing positionsx3d. Per pt's daughter-in-law pt has had slurred speechx3d. Pt fell out of bed in the last 32 hours.   Allergies No Known Allergies  Level of Care/Admitting Diagnosis ED Disposition     ED Disposition  Admit   Condition  --   Comment  Hospital Area: [facility] [[id number]]  Level of Care: Telemetry Medical [104]  May admit patient to [facility] or [facility] if equivalent level of care is available:: Yes  Covid Evaluation: Asymptomatic - no recent exposure (last 10 days) testing not required  Diagnosis: Falls frequently [244269]  Admitting Physician: Alan Mulder [[id number]]  Attending Physician: Alan Mulder [[id number]]  Certification:: I certify this patient will need inpatient services for at least 2 midnights  Expected Medical Readiness: 08/25/2023          B Medical/Surgery History Past Medical History:  Diagnosis Date   Anxiety    Arthritis    Cancer (HCC)    breast   Dementia (HCC)    Depression    Diabetes mellitus without complication (HCC)    Hyperlipemia    Hypertension    PONV (postoperative nausea and vomiting)    Sleep apnea    uses a cpap   Past Surgical History:  Procedure Laterality Date   APPENDECTOMY     ARTERY EXPLORATION  08/25/2012   Procedure: ARTERY EXPLORATION;  Surgeon: Nicki Reaper, MD;  Location: [facility];  Service: Orthopedics;  Laterality: Right;  Application External Fixator Exploration Digital Artery Right Little  Finger/Exploration Artery and Nerve Right Ring Finger   BREAST LUMPECTOMY     right   BREAST SURGERY     multiple br bx-   CERVICAL POLYPECTOMY     EXTERNAL FIXATION REMOVAL  10/07/2012   Procedure: REMOVAL EXTERNAL FIXATION ARM;  Surgeon: Nicki Reaper, MD;  Location: [facility];  Service: Orthopedics;  Laterality: Right;   HERNIA REPAIR     abd   LUMBAR LAMINECTOMY  1985   NERVE EXPLORATION  08/25/2012   Procedure: NERVE EXPLORATION;  Surgeon: Nicki Reaper, MD;  Location: [facility];  Service: Orthopedics;  Laterality: Right;   ORIF HUMERUS FRACTURE Left 07/01/2016   Procedure: OPEN TREATMENT OF LEFT  PROXIMAL HUMERUS FRACTURE;  Surgeon: Mack Hook, MD;  Location: [facility];  Service: Orthopedics;  Laterality: Left;  GENERAL ANESTHESIA WITH PRE-OP BLOCK   PROXIMAL INTERPHALANGEAL FUSION (PIP)  10/07/2012   Procedure: PROXIMAL INTERPHALANGEAL FUSION (PIP);  Surgeon: Nicki Reaper, MD;  Location: [facility];  Service: Orthopedics;  Laterality: Right;  FUSION PIP RIGHT SMALL FINGER, DISTAL RADIUS GRAFT, REMOVAL OF EXTERNAL FIXATOR, SYNTHESE MINI     A IV Location/Drains/Wounds Patient Lines/Drains/Airways Status     Active Line/Drains/Airways     Name Placement date Placement time Site Days   Peripheral IV 08/23/23 20 G Right Antecubital 08/23/23  1757  Antecubital  less than 1   Incision 08/25/12 Finger (Comment which one) Right 08/25/12  1240  --  4015   Incision 10/07/12 Hand Right 10/07/12  1453  -- 3972   Incision (Closed) 07/01/16 Shoulder Left 07/01/16  1255  -- 2609            Intake/Output Last 24 hours No intake or output data in the 24 hours ending 08/23/23 1758  Labs/Imaging Results for orders placed or performed during the hospital encounter of 08/23/23 (from the past 48 hour(s))  Protime-INR     Status: None   Collection Time: 08/23/23 11:34 AM  Result Value Ref Range   Prothrombin Time 13.8 11.4 -  15.2 seconds   INR 1.0 0.8 - 1.2    Comment: (NOTE) INR goal varies based on device and disease states. Performed at [facility], [address], [place], [place] [address]   APTT     Status: None   Collection Time: 08/23/23 11:34 AM  Result Value Ref Range   aPTT 25 24 - 36 seconds    Comment: Performed at [facility], [address] [place], [place] [address]  CBC     Status: Abnormal   Collection Time: 08/23/23 11:34 AM  Result Value Ref Range   WBC 17.0 (H) 4.0 - 10.5 K/uL   RBC 4.65 3.87 - 5.11 MIL/uL   Hemoglobin 13.7 12.0 - 15.0 g/dL   HCT 09.8 11.9 - 14.7 %   MCV 90.1 80.0 - 100.0 fL   MCH 29.5 26.0 - 34.0 pg   MCHC 32.7 30.0 - 36.0 g/dL   RDW 82.9 56.2 - 13.0 %   Platelets 253 150 - 400 K/uL   nRBC 0.0 0.0 - 0.2 %    Comment: Performed at [facility], [address] [place], [place] [address]  Differential     Status: Abnormal   Collection Time: 08/23/23 11:34 AM  Result Value Ref Range   Neutrophils Relative % 80 %   Neutro Abs 13.7 (H) 1.7 - 7.7 K/uL   Lymphocytes Relative 12 %   Lymphs Abs 2.0 0.7 - 4.0 K/uL   Monocytes Relative 6 %   Monocytes Absolute 1.0 0.1 - 1.0 K/uL   Eosinophils Relative 1 %   Eosinophils Absolute 0.2 0.0 - 0.5 K/uL   Basophils Relative 1 %   Basophils Absolute 0.1 0.0 - 0.1 K/uL   Immature Granulocytes 0 %   Abs Immature Granulocytes 0.07 0.00 - 0.07 K/uL    Comment: Performed at [facility], [address] [place], [place] [address]  Comprehensive metabolic panel     Status: Abnormal   Collection Time: 08/23/23 11:34 AM  Result Value Ref Range   Sodium 138 135 - 145 mmol/L   Potassium 3.8 3.5 - 5.1 mmol/L   Chloride 100 98 - 111 mmol/L   CO2 22 22 - 32 mmol/L   Glucose, Bld 207 (H) 70 - 99 mg/dL    Comment: Glucose reference range applies only to samples taken after fasting for at least 8 hours.   BUN 14 8 - 23 mg/dL   Creatinine, Ser 9.52 (H) 0.44 - 1.00 mg/dL   Calcium 9.0 8.9 - 84.1 mg/dL    Total Protein 6.3 (L) 6.5 - 8.1 g/dL   Albumin 3.7 3.5 - 5.0 g/dL   AST 19 15 - 41 U/L   ALT 17 0 - 44 U/L   Alkaline Phosphatase 62 38 - 126 U/L   Total Bilirubin 1.2 0.3 - 1.2 mg/dL   GFR, Estimated 53 (L) >60 mL/min  Comment: (NOTE) Calculated using the CKD-EPI Creatinine Equation (2021)    Anion gap 16 (H) 5 - 15    Comment: Performed at [facility], [address] [place], [place] [address]  Ethanol     Status: None   Collection Time: 08/23/23 11:34 AM  Result Value Ref Range   Alcohol, Ethyl (B) <10 <10 mg/dL    Comment: (NOTE) Lowest detectable limit for serum alcohol is 10 mg/dL.  For medical purposes only. Performed at [facility], [address] [place], [place] [address]   I-stat chem 8, ED     Status: Abnormal   Collection Time: 08/23/23 11:44 AM  Result Value Ref Range   Sodium 138 135 - 145 mmol/L   Potassium 3.7 3.5 - 5.1 mmol/L   Chloride 103 98 - 111 mmol/L   BUN 16 8 - 23 mg/dL   Creatinine, Ser 0.98 (H) 0.44 - 1.00 mg/dL   Glucose, Bld 119 (H) 70 - 99 mg/dL    Comment: Glucose reference range applies only to samples taken after fasting for at least 8 hours.   Calcium, Ion 1.06 (L) 1.15 - 1.40 mmol/L   TCO2 21 (L) 22 - 32 mmol/L   Hemoglobin 14.6 12.0 - 15.0 g/dL   HCT 14.7 82.9 - 56.2 %  Ammonia     Status: None   Collection Time: 08/23/23 11:45 AM  Result Value Ref Range   Ammonia 19 9 - 35 umol/L    Comment: Performed at [facility], [address] [place], [place] [address]  CBG monitoring, ED     Status: None   Collection Time: 08/23/23  5:13 PM  Result Value Ref Range   Glucose-Capillary 99 70 - 99 mg/dL    Comment: Glucose reference range applies only to samples taken after fasting for at least 8 hours.   DG Ribs Unilateral W/Chest Left  Result Date: 08/23/2023 CLINICAL DATA:  77 year old female with history of trauma from a fall complaining of left-sided rib pain. EXAM: LEFT RIBS AND CHEST - 3+ VIEW COMPARISON:  No  priors. FINDINGS: Minimally displaced fractures of the lateral left ninth rib and anterior left sixth rib are noted. Lung volumes are low. No consolidative airspace disease. No pleural effusions. No pneumothorax. No pulmonary nodule or mass noted. Pulmonary vasculature and the cardiomediastinal silhouette are within normal limits. Atherosclerosis in the thoracic aorta. Orthopedic fixation hardware in the left proximal humerus partially imaged with advanced degenerative changes in the left glenohumeral joint likely related to remote trauma. IMPRESSION: 1. Minimally displaced fractures of the left ninth and sixth ribs, as above. 2. Low lung volumes.  No pneumothorax or other acute findings. 3. Aortic atherosclerosis. Electronically Signed   By: Trudie Reed M.D.   On: 08/23/2023 13:07   CT HEAD WO CONTRAST  Result Date: 08/23/2023 CLINICAL DATA:  Acute stroke suspected.  Neck trauma EXAM: CT HEAD WITHOUT CONTRAST CT CERVICAL SPINE WITHOUT CONTRAST TECHNIQUE: Multidetector CT imaging of the head and cervical spine was performed following the standard protocol without intravenous contrast. Multiplanar CT image reconstructions of the cervical spine were also generated. RADIATION DOSE REDUCTION: This exam was performed according to the departmental dose-optimization program which includes automated exposure control, adjustment of the mA and/or kV according to patient size and/or use of iterative reconstruction technique. COMPARISON:  None Available. FINDINGS: CT HEAD FINDINGS Brain: No evidence of acute infarction, hemorrhage, hydrocephalus, extra-axial collection or mass lesion/mass effect. Chronic small vessel ischemic gliosis in  the cerebral white matter. Mild cerebral volume loss for age. Vascular: No hyperdense vessel or unexpected calcification. Skull: Normal. Negative for fracture or focal lesion. Sinuses/Orbits: No acute finding. CT CERVICAL SPINE FINDINGS Alignment: No traumatic malalignment. Mild  degenerative anterolisthesis at C3-4 to C5-6. Skull base and vertebrae: No acute fracture. No primary bone lesion or focal pathologic process. Soft tissues and spinal canal: No prevertebral fluid or swelling. No visible canal hematoma. Disc levels:  Generalized bulky degenerative facet spurring. Upper chest: No evidence of injury IMPRESSION: No evidence of acute intracranial or cervical spine injury. No acute intracranial finding. Electronically Signed   By: Tiburcio Pea M.D.   On: 08/23/2023 12:46   CT Cervical Spine Wo Contrast  Result Date: 08/23/2023 CLINICAL DATA:  Acute stroke suspected.  Neck trauma EXAM: CT HEAD WITHOUT CONTRAST CT CERVICAL SPINE WITHOUT CONTRAST TECHNIQUE: Multidetector CT imaging of the head and cervical spine was performed following the standard protocol without intravenous contrast. Multiplanar CT image reconstructions of the cervical spine were also generated. RADIATION DOSE REDUCTION: This exam was performed according to the departmental dose-optimization program which includes automated exposure control, adjustment of the mA and/or kV according to patient size and/or use of iterative reconstruction technique. COMPARISON:  None Available. FINDINGS: CT HEAD FINDINGS Brain: No evidence of acute infarction, hemorrhage, hydrocephalus, extra-axial collection or mass lesion/mass effect. Chronic small vessel ischemic gliosis in the cerebral white matter. Mild cerebral volume loss for age. Vascular: No hyperdense vessel or unexpected calcification. Skull: Normal. Negative for fracture or focal lesion. Sinuses/Orbits: No acute finding. CT CERVICAL SPINE FINDINGS Alignment: No traumatic malalignment. Mild degenerative anterolisthesis at C3-4 to C5-6. Skull base and vertebrae: No acute fracture. No primary bone lesion or focal pathologic process. Soft tissues and spinal canal: No prevertebral fluid or swelling. No visible canal hematoma. Disc levels:  Generalized bulky degenerative facet  spurring. Upper chest: No evidence of injury IMPRESSION: No evidence of acute intracranial or cervical spine injury. No acute intracranial finding. Electronically Signed   By: Tiburcio Pea M.D.   On: 08/23/2023 12:46    Pending Labs Unresulted Labs (From admission, onward)     Start     Ordered   08/30/23 0500  Creatinine, serum  (enoxaparin (LOVENOX)    CrCl >/= 30 ml/min)  Weekly,   R     Comments: while on enoxaparin therapy    08/23/23 1631   08/23/23 1630  CBC  (enoxaparin (LOVENOX)    CrCl >/= 30 ml/min)  Once,   R       Comments: Baseline for enoxaparin therapy IF NOT ALREADY DRAWN.  Notify MD if PLT < 100 K.    08/23/23 1631   08/23/23 1630  Creatinine, serum  (enoxaparin (LOVENOX)    CrCl >/= 30 ml/min)  Once,   R       Comments: Baseline for enoxaparin therapy IF NOT ALREADY DRAWN.    08/23/23 1631   08/23/23 1139  Urinalysis, Routine w reflex microscopic -Urine, Clean Catch  Once,   URGENT       Question:  Specimen Source  Answer:  Urine, Clean Catch   08/23/23 1139            Vitals/Pain Today's Vitals   08/23/23 1127 08/23/23 1445 08/23/23 1540 08/23/23 1613  BP:  (!) 122/93 (!) 150/90 121/66  Pulse:  92 84 64  Resp:   20 18  Temp:   97.7 F (36.5 C) 97.9 F (36.6 C)  TempSrc:   Oral  Oral  SpO2:  99% 99% 100%  Weight: 121.6 kg     Height: 5\' 6"  (1.676 m)     PainSc:        Isolation Precautions No active isolations  Medications Medications  sodium chloride flush (NS) 0.9 % injection 3 mL (has no administration in time range)  allopurinol (ZYLOPRIM) tablet 100 mg (100 mg Oral Given 08/23/23 1757)  colchicine tablet 1.2 mg (has no administration in time range)  pravastatin (PRAVACHOL) tablet 40 mg (has no administration in time range)  gabapentin (NEURONTIN) capsule 300 mg (300 mg Oral Given 08/23/23 1757)  enoxaparin (LOVENOX) injection 60 mg (60 mg Subcutaneous Given 08/23/23 1757)  ondansetron (ZOFRAN) tablet 4 mg (has no administration in time  range)    Or  ondansetron (ZOFRAN) injection 4 mg (has no administration in time range)  fosfomycin (MONUROL) packet 3 g (has no administration in time range)  lactated ringers infusion ( Intravenous New Bag/Given 08/23/23 1757)  insulin aspart (novoLOG) injection 0-24 Units ( Subcutaneous Not Given 08/23/23 1719)    Mobility walks     Focused Assessments Musculoskeletal and renal   R Recommendations: See Admitting Provider Note  Report given to:   Additional Notes:

## 2023-08-23 NOTE — ED Notes (Signed)
ED TO INPATIENT HANDOFF REPORT  ED Nurse Name and Phone #: Joselyn Glassman [phone])  S Name/Age/Gender Brandi [name] 77 y.o. female Room/Bed: 038C/038C  Code Status   Code Status: Full Code  Home/SNF/Other Home Patient oriented to: self, place, time, and situation Is this baseline? Yes   Triage Complete: Triage complete  Chief Complaint Falls frequently [R29.6]  Triage Note Per pt's daughter-in-law pt has fell five times in the last 32 hours. Pt has been dizzy when changing positionsx3d. Per pt's daughter-in-law pt has had slurred speechx3d. Pt fell out of bed in the last 32 hours.   Allergies No Known Allergies  Level of Care/Admitting Diagnosis ED Disposition     ED Disposition  Admit   Condition  --   Comment  Hospital Area: [facility] [[id number]]  Level of Care: Telemetry Medical [104]  May admit patient to [facility] or [facility] if equivalent level of care is available:: Yes  Covid Evaluation: Asymptomatic - no recent exposure (last 10 days) testing not required  Diagnosis: Falls frequently [244269]  Admitting Physician: Alan Mulder [[id number]]  Attending Physician: Alan Mulder [[id number]]  Certification:: I certify this patient will need inpatient services for at least 2 midnights  Expected Medical Readiness: 08/25/2023          B Medical/Surgery History Past Medical History:  Diagnosis Date   Anxiety    Arthritis    Cancer (HCC)    breast   Dementia (HCC)    Depression    Diabetes mellitus without complication (HCC)    Hyperlipemia    Hypertension    PONV (postoperative nausea and vomiting)    Sleep apnea    uses a cpap   Past Surgical History:  Procedure Laterality Date   APPENDECTOMY     ARTERY EXPLORATION  08/25/2012   Procedure: ARTERY EXPLORATION;  Surgeon: Nicki Reaper, MD;  Location: [facility];  Service: Orthopedics;  Laterality: Right;  Application External Fixator Exploration Digital Artery Right  Little Finger/Exploration Artery and Nerve Right Ring Finger   BREAST LUMPECTOMY     right   BREAST SURGERY     multiple br bx-   CERVICAL POLYPECTOMY     EXTERNAL FIXATION REMOVAL  10/07/2012   Procedure: REMOVAL EXTERNAL FIXATION ARM;  Surgeon: Nicki Reaper, MD;  Location: [facility];  Service: Orthopedics;  Laterality: Right;   HERNIA REPAIR     abd   LUMBAR LAMINECTOMY  1985   NERVE EXPLORATION  08/25/2012   Procedure: NERVE EXPLORATION;  Surgeon: Nicki Reaper, MD;  Location: [facility];  Service: Orthopedics;  Laterality: Right;   ORIF HUMERUS FRACTURE Left 07/01/2016   Procedure: OPEN TREATMENT OF LEFT  PROXIMAL HUMERUS FRACTURE;  Surgeon: Mack Hook, MD;  Location: [facility];  Service: Orthopedics;  Laterality: Left;  GENERAL ANESTHESIA WITH PRE-OP BLOCK   PROXIMAL INTERPHALANGEAL FUSION (PIP)  10/07/2012   Procedure: PROXIMAL INTERPHALANGEAL FUSION (PIP);  Surgeon: Nicki Reaper, MD;  Location: [facility];  Service: Orthopedics;  Laterality: Right;  FUSION PIP RIGHT SMALL FINGER, DISTAL RADIUS GRAFT, REMOVAL OF EXTERNAL FIXATOR, SYNTHESE MINI     A IV Location/Drains/Wounds Patient Lines/Drains/Airways Status     Active Line/Drains/Airways     Name Placement date Placement time Site Days   Peripheral IV 08/23/23 20 G Right Antecubital 08/23/23  1757  Antecubital  less than 1   Incision 08/25/12 Finger (Comment which one) Right 08/25/12  1240  --  4015   Incision 10/07/12 Hand Right 10/07/12  1453  -- 3972   Incision (Closed) 07/01/16 Shoulder Left 07/01/16  1255  -- 2609            Intake/Output Last 24 hours No intake or output data in the 24 hours ending 08/23/23 2001  Labs/Imaging Results for orders placed or performed during the hospital encounter of 08/23/23 (from the past 48 hour(s))  Protime-INR     Status: None   Collection Time: 08/23/23 11:34 AM  Result Value Ref Range   Prothrombin Time 13.8  11.4 - 15.2 seconds   INR 1.0 0.8 - 1.2    Comment: (NOTE) INR goal varies based on device and disease states. Performed at [facility], [address], [place], [place] [address]   APTT     Status: None   Collection Time: 08/23/23 11:34 AM  Result Value Ref Range   aPTT 25 24 - 36 seconds    Comment: Performed at [facility], [address] [place], [place] [address]  CBC     Status: Abnormal   Collection Time: 08/23/23 11:34 AM  Result Value Ref Range   WBC 17.0 (H) 4.0 - 10.5 K/uL   RBC 4.65 3.87 - 5.11 MIL/uL   Hemoglobin 13.7 12.0 - 15.0 g/dL   HCT 09.8 11.9 - 14.7 %   MCV 90.1 80.0 - 100.0 fL   MCH 29.5 26.0 - 34.0 pg   MCHC 32.7 30.0 - 36.0 g/dL   RDW 82.9 56.2 - 13.0 %   Platelets 253 150 - 400 K/uL   nRBC 0.0 0.0 - 0.2 %    Comment: Performed at [facility], [address], [place], [place] [address]  Differential     Status: Abnormal   Collection Time: 08/23/23 11:34 AM  Result Value Ref Range   Neutrophils Relative % 80 %   Neutro Abs 13.7 (H) 1.7 - 7.7 K/uL   Lymphocytes Relative 12 %   Lymphs Abs 2.0 0.7 - 4.0 K/uL   Monocytes Relative 6 %   Monocytes Absolute 1.0 0.1 - 1.0 K/uL   Eosinophils Relative 1 %   Eosinophils Absolute 0.2 0.0 - 0.5 K/uL   Basophils Relative 1 %   Basophils Absolute 0.1 0.0 - 0.1 K/uL   Immature Granulocytes 0 %   Abs Immature Granulocytes 0.07 0.00 - 0.07 K/uL    Comment: Performed at [facility], [address] [place], [place] [address]  Comprehensive metabolic panel     Status: Abnormal   Collection Time: 08/23/23 11:34 AM  Result Value Ref Range   Sodium 138 135 - 145 mmol/L   Potassium 3.8 3.5 - 5.1 mmol/L   Chloride 100 98 - 111 mmol/L   CO2 22 22 - 32 mmol/L   Glucose, Bld 207 (H) 70 - 99 mg/dL    Comment: Glucose reference range applies only to samples taken after fasting for at least 8 hours.   BUN 14 8 - 23 mg/dL   Creatinine, Ser 9.52 (H) 0.44 - 1.00 mg/dL   Calcium 9.0 8.9 - 84.1  mg/dL   Total Protein 6.3 (L) 6.5 - 8.1 g/dL   Albumin 3.7 3.5 - 5.0 g/dL   AST 19 15 - 41 U/L   ALT 17 0 - 44 U/L   Alkaline Phosphatase 62 38 - 126 U/L   Total Bilirubin 1.2 0.3 - 1.2 mg/dL   GFR, Estimated 53 (L) >60 mL/min  Comment: (NOTE) Calculated using the CKD-EPI Creatinine Equation (2021)    Anion gap 16 (H) 5 - 15    Comment: Performed at [facility], [address] [place], [place] [address]  Ethanol     Status: None   Collection Time: 08/23/23 11:34 AM  Result Value Ref Range   Alcohol, Ethyl (B) <10 <10 mg/dL    Comment: (NOTE) Lowest detectable limit for serum alcohol is 10 mg/dL.  For medical purposes only. Performed at [facility], [address] [place], [place] [address]   I-stat chem 8, ED     Status: Abnormal   Collection Time: 08/23/23 11:44 AM  Result Value Ref Range   Sodium 138 135 - 145 mmol/L   Potassium 3.7 3.5 - 5.1 mmol/L   Chloride 103 98 - 111 mmol/L   BUN 16 8 - 23 mg/dL   Creatinine, Ser 2.95 (H) 0.44 - 1.00 mg/dL   Glucose, Bld 188 (H) 70 - 99 mg/dL    Comment: Glucose reference range applies only to samples taken after fasting for at least 8 hours.   Calcium, Ion 1.06 (L) 1.15 - 1.40 mmol/L   TCO2 21 (L) 22 - 32 mmol/L   Hemoglobin 14.6 12.0 - 15.0 g/dL   HCT 41.6 60.6 - 30.1 %  Ammonia     Status: None   Collection Time: 08/23/23 11:45 AM  Result Value Ref Range   Ammonia 19 9 - 35 umol/L    Comment: Performed at [facility], [address], [place], [place] [address]  CBG monitoring, ED     Status: None   Collection Time: 08/23/23  5:13 PM  Result Value Ref Range   Glucose-Capillary 99 70 - 99 mg/dL    Comment: Glucose reference range applies only to samples taken after fasting for at least 8 hours.  CBC     Status: Abnormal   Collection Time: 08/23/23  6:05 PM  Result Value Ref Range   WBC 15.3 (H) 4.0 - 10.5 K/uL   RBC 4.75 3.87 - 5.11 MIL/uL   Hemoglobin 14.1 12.0 - 15.0 g/dL   HCT 32.3 55.7 - 32.2  %   MCV 89.7 80.0 - 100.0 fL   MCH 29.7 26.0 - 34.0 pg   MCHC 33.1 30.0 - 36.0 g/dL   RDW 02.5 42.7 - 06.2 %   Platelets 285 150 - 400 K/uL   nRBC 0.0 0.0 - 0.2 %    Comment: Performed at [facility], [address], [place], [place] [address]  Creatinine, serum     Status: Abnormal   Collection Time: 08/23/23  6:05 PM  Result Value Ref Range   Creatinine, Ser 1.02 (H) 0.44 - 1.00 mg/dL   GFR, Estimated 58 (L) >60 mL/min    Comment: (NOTE) Calculated using the CKD-EPI Creatinine Equation (2021) Performed at [facility], [address] [place], [place] [address]    DG Ribs Unilateral W/Chest Left  Result Date: 08/23/2023 CLINICAL DATA:  77 year old female with history of trauma from a fall complaining of left-sided rib pain. EXAM: LEFT RIBS AND CHEST - 3+ VIEW COMPARISON:  No priors. FINDINGS: Minimally displaced fractures of the lateral left ninth rib and anterior left sixth rib are noted. Lung volumes are low. No consolidative airspace disease. No pleural effusions. No pneumothorax. No pulmonary nodule or mass noted. Pulmonary vasculature and the cardiomediastinal silhouette are within normal limits. Atherosclerosis in the thoracic aorta. Orthopedic fixation  hardware in the left proximal humerus partially imaged with advanced degenerative changes in the left glenohumeral joint likely related to remote trauma. IMPRESSION: 1. Minimally displaced fractures of the left ninth and sixth ribs, as above. 2. Low lung volumes.  No pneumothorax or other acute findings. 3. Aortic atherosclerosis. Electronically Signed   By: Trudie Reed M.D.   On: 08/23/2023 13:07   CT HEAD WO CONTRAST  Result Date: 08/23/2023 CLINICAL DATA:  Acute stroke suspected.  Neck trauma EXAM: CT HEAD WITHOUT CONTRAST CT CERVICAL SPINE WITHOUT CONTRAST TECHNIQUE: Multidetector CT imaging of the head and cervical spine was performed following the standard protocol without intravenous contrast. Multiplanar CT  image reconstructions of the cervical spine were also generated. RADIATION DOSE REDUCTION: This exam was performed according to the departmental dose-optimization program which includes automated exposure control, adjustment of the mA and/or kV according to patient size and/or use of iterative reconstruction technique. COMPARISON:  None Available. FINDINGS: CT HEAD FINDINGS Brain: No evidence of acute infarction, hemorrhage, hydrocephalus, extra-axial collection or mass lesion/mass effect. Chronic small vessel ischemic gliosis in the cerebral white matter. Mild cerebral volume loss for age. Vascular: No hyperdense vessel or unexpected calcification. Skull: Normal. Negative for fracture or focal lesion. Sinuses/Orbits: No acute finding. CT CERVICAL SPINE FINDINGS Alignment: No traumatic malalignment. Mild degenerative anterolisthesis at C3-4 to C5-6. Skull base and vertebrae: No acute fracture. No primary bone lesion or focal pathologic process. Soft tissues and spinal canal: No prevertebral fluid or swelling. No visible canal hematoma. Disc levels:  Generalized bulky degenerative facet spurring. Upper chest: No evidence of injury IMPRESSION: No evidence of acute intracranial or cervical spine injury. No acute intracranial finding. Electronically Signed   By: Tiburcio Pea M.D.   On: 08/23/2023 12:46   CT Cervical Spine Wo Contrast  Result Date: 08/23/2023 CLINICAL DATA:  Acute stroke suspected.  Neck trauma EXAM: CT HEAD WITHOUT CONTRAST CT CERVICAL SPINE WITHOUT CONTRAST TECHNIQUE: Multidetector CT imaging of the head and cervical spine was performed following the standard protocol without intravenous contrast. Multiplanar CT image reconstructions of the cervical spine were also generated. RADIATION DOSE REDUCTION: This exam was performed according to the departmental dose-optimization program which includes automated exposure control, adjustment of the mA and/or kV according to patient size and/or use of  iterative reconstruction technique. COMPARISON:  None Available. FINDINGS: CT HEAD FINDINGS Brain: No evidence of acute infarction, hemorrhage, hydrocephalus, extra-axial collection or mass lesion/mass effect. Chronic small vessel ischemic gliosis in the cerebral white matter. Mild cerebral volume loss for age. Vascular: No hyperdense vessel or unexpected calcification. Skull: Normal. Negative for fracture or focal lesion. Sinuses/Orbits: No acute finding. CT CERVICAL SPINE FINDINGS Alignment: No traumatic malalignment. Mild degenerative anterolisthesis at C3-4 to C5-6. Skull base and vertebrae: No acute fracture. No primary bone lesion or focal pathologic process. Soft tissues and spinal canal: No prevertebral fluid or swelling. No visible canal hematoma. Disc levels:  Generalized bulky degenerative facet spurring. Upper chest: No evidence of injury IMPRESSION: No evidence of acute intracranial or cervical spine injury. No acute intracranial finding. Electronically Signed   By: Tiburcio Pea M.D.   On: 08/23/2023 12:46    Pending Labs Unresulted Labs (From admission, onward)     Start     Ordered   08/30/23 0500  Creatinine, serum  (enoxaparin (LOVENOX)    CrCl >/= 30 ml/min)  Weekly,   R     Comments: while on enoxaparin therapy    08/23/23 1631   08/23/23 1139  Urinalysis, Routine w reflex microscopic -Urine, Clean Catch  Once,   URGENT       Question:  Specimen Source  Answer:  Urine, Clean Catch   08/23/23 1139            Vitals/Pain Today's Vitals   08/23/23 1127 08/23/23 1445 08/23/23 1540 08/23/23 1613  BP:  (!) 122/93 (!) 150/90 121/66  Pulse:  92 84 64  Resp:   20 18  Temp:   97.7 F (36.5 C) 97.9 F (36.6 C)  TempSrc:   Oral Oral  SpO2:  99% 99% 100%  Weight: 121.6 kg     Height: 5\' 6"  (1.676 m)     PainSc:        Isolation Precautions No active isolations  Medications Medications  sodium chloride flush (NS) 0.9 % injection 3 mL (has no administration in time  range)  allopurinol (ZYLOPRIM) tablet 100 mg (100 mg Oral Given 08/23/23 1757)  colchicine tablet 1.2 mg (has no administration in time range)  pravastatin (PRAVACHOL) tablet 40 mg (has no administration in time range)  gabapentin (NEURONTIN) capsule 300 mg (300 mg Oral Given 08/23/23 1757)  enoxaparin (LOVENOX) injection 60 mg (60 mg Subcutaneous Given 08/23/23 1757)  ondansetron (ZOFRAN) tablet 4 mg (has no administration in time range)    Or  ondansetron (ZOFRAN) injection 4 mg (has no administration in time range)  lactated ringers infusion ( Intravenous New Bag/Given 08/23/23 1757)  insulin aspart (novoLOG) injection 0-24 Units ( Subcutaneous Not Given 08/23/23 1719)  fosfomycin (MONUROL) packet 3 g (3 g Oral Given 08/23/23 1806)    Mobility walks     Focused Assessments    R Recommendations: See Admitting Provider Note  Report given to:   Additional Notes:

## 2023-08-24 DIAGNOSIS — F0390 Unspecified dementia without behavioral disturbance: Secondary | ICD-10-CM | POA: Insufficient documentation

## 2023-08-24 DIAGNOSIS — I10 Essential (primary) hypertension: Secondary | ICD-10-CM | POA: Insufficient documentation

## 2023-08-24 DIAGNOSIS — N390 Urinary tract infection, site not specified: Secondary | ICD-10-CM | POA: Insufficient documentation

## 2023-08-24 DIAGNOSIS — E1140 Type 2 diabetes mellitus with diabetic neuropathy, unspecified: Secondary | ICD-10-CM | POA: Diagnosis present

## 2023-08-24 DIAGNOSIS — R296 Repeated falls: Secondary | ICD-10-CM | POA: Diagnosis not present

## 2023-08-24 DIAGNOSIS — I951 Orthostatic hypotension: Secondary | ICD-10-CM | POA: Insufficient documentation

## 2023-08-24 LAB — CBC
HCT: 41.4 % (ref 36.0–46.0)
Hemoglobin: 13.6 g/dL (ref 12.0–15.0)
MCH: 29.4 pg (ref 26.0–34.0)
MCHC: 32.9 g/dL (ref 30.0–36.0)
MCV: 89.4 fL (ref 80.0–100.0)
Platelets: 246 10*3/uL (ref 150–400)
RBC: 4.63 MIL/uL (ref 3.87–5.11)
RDW: 13.8 % (ref 11.5–15.5)
WBC: 11.5 10*3/uL — ABNORMAL HIGH (ref 4.0–10.5)
nRBC: 0 % (ref 0.0–0.2)

## 2023-08-24 LAB — RENAL FUNCTION PANEL
Albumin: 3.6 g/dL (ref 3.5–5.0)
Anion gap: 12 (ref 5–15)
BUN: 10 mg/dL (ref 8–23)
CO2: 26 mmol/L (ref 22–32)
Calcium: 8.8 mg/dL — ABNORMAL LOW (ref 8.9–10.3)
Chloride: 102 mmol/L (ref 98–111)
Creatinine, Ser: 0.91 mg/dL (ref 0.44–1.00)
GFR, Estimated: >60 mL/min (ref >60)
Glucose, Bld: 154 mg/dL — ABNORMAL HIGH (ref 70–99)
Phosphorus: 4.1 mg/dL (ref 2.5–4.6)
Potassium: 3.8 mmol/L (ref 3.5–5.1)
Sodium: 140 mmol/L (ref 135–145)

## 2023-08-24 LAB — URINALYSIS, ROUTINE W REFLEX MICROSCOPIC
Bilirubin Urine: NEGATIVE
Glucose, UA: NEGATIVE mg/dL
Hgb urine dipstick: NEGATIVE
Ketones, ur: NEGATIVE mg/dL
Leukocytes,Ua: NEGATIVE
Nitrite: NEGATIVE
Protein, ur: NEGATIVE mg/dL
Specific Gravity, Urine: 1.011 (ref 1.005–1.030)
pH: 5 (ref 5.0–8.0)

## 2023-08-24 LAB — MAGNESIUM: Magnesium: 1.2 mg/dL — ABNORMAL LOW (ref 1.7–2.4)

## 2023-08-24 LAB — GLUCOSE, CAPILLARY
Glucose-Capillary: 96 mg/dL (ref 70–99)
Glucose-Capillary: 173 mg/dL — ABNORMAL HIGH (ref 70–99)

## 2023-08-24 MED ORDER — MAGNESIUM SULFATE 4 GM/100ML IV SOLN
4.0000 g | Freq: Once | INTRAVENOUS | Status: AC
  Administered 2023-08-24: 4 g via INTRAVENOUS
  Filled 2023-08-24: qty 100

## 2023-08-24 MED ORDER — LISINOPRIL 20 MG PO TABS
20.0000 mg | ORAL_TABLET | Freq: Every day | ORAL | Status: DC
  Administered 2023-08-24 – 2023-08-25 (×2): 20 mg via ORAL
  Filled 2023-08-24 (×2): qty 1

## 2023-08-24 MED ORDER — MONTELUKAST SODIUM 10 MG PO TABS
10.0000 mg | ORAL_TABLET | Freq: Every day | ORAL | Status: DC
  Filled 2023-08-24: qty 1

## 2023-08-24 MED ORDER — METFORMIN HCL 500 MG PO TABS: 1000.0000 mg | ORAL_TABLET | Freq: Two times a day (BID) | ORAL | Status: DC

## 2023-08-24 MED ORDER — KETOROLAC TROMETHAMINE 15 MG/ML IJ SOLN
15.0000 mg | Freq: Once | INTRAMUSCULAR | Status: DC
  Filled 2023-08-24: qty 1

## 2023-08-24 MED ORDER — FLUTICASONE PROPIONATE 50 MCG/ACT NA SUSP: 1.0000 | Freq: Every day | NASAL | Status: DC | PRN

## 2023-08-24 MED ORDER — METFORMIN HCL 500 MG PO TABS
1000.0000 mg | ORAL_TABLET | Freq: Two times a day (BID) | ORAL | Status: DC
  Administered 2023-08-24 – 2023-08-25 (×2): 1000 mg via ORAL
  Filled 2023-08-24 (×2): qty 2

## 2023-08-24 MED ORDER — GLIPIZIDE 5 MG PO TABS
5.0000 mg | ORAL_TABLET | Freq: Every day | ORAL | Status: DC
  Administered 2023-08-24 – 2023-08-25 (×2): 5 mg via ORAL
  Filled 2023-08-24 (×2): qty 1

## 2023-08-24 MED ORDER — DOCUSATE SODIUM 100 MG PO CAPS: 100.0000 mg | ORAL_CAPSULE | Freq: Every day | ORAL | Status: DC | PRN

## 2023-08-24 MED ORDER — CITALOPRAM HYDROBROMIDE 20 MG PO TABS
20.0000 mg | ORAL_TABLET | Freq: Every day | ORAL | Status: DC
  Administered 2023-08-24 – 2023-08-25 (×2): 20 mg via ORAL
  Filled 2023-08-24 (×2): qty 1

## 2023-08-24 MED ORDER — ACETAMINOPHEN 500 MG PO TABS
1000.0000 mg | ORAL_TABLET | Freq: Three times a day (TID) | ORAL | Status: DC | PRN
  Administered 2023-08-24: 1000 mg via ORAL
  Filled 2023-08-24: qty 2

## 2023-08-24 MED ORDER — LIDOCAINE 5 % EX PTCH
1.0000 | MEDICATED_PATCH | CUTANEOUS | Status: DC
  Administered 2023-08-24: 1 via TRANSDERMAL
  Filled 2023-08-24: qty 1

## 2023-08-24 MED ORDER — AZELASTINE HCL 0.1 % NA SOLN
1.0000 | Freq: Every day | NASAL | Status: DC
  Administered 2023-08-24 – 2023-08-25 (×2): 1 via NASAL
  Filled 2023-08-24: qty 30

## 2023-08-24 NOTE — Progress Notes (Signed)
PROGRESS NOTE  Da Verney AVW:[id number] DOB: 1946/10/21   PCP: Ardath Sax, FNP  Patient is from: Home.  Lives alone.  Has rollator  DOA: 08/23/2023 LOS: 1  Chief complaints Chief Complaint  Patient presents with   Fall   Weakness     Brief Narrative / Interim history: 77 year old F with PMH of dementia, DM-2, HTN, HLD, breast cancer, depression and obesity presenting with frequent falls, left chest pain, dizziness with standing, weakness and poor p.o. intake.  She reports about 5 in the last 3 days.  She had left-sided chest wall pain after her last fall.  She also have orthostasis, weakness and poor p.o. intake.  She was recently started on Ozempic and reportedly lost over 100 pounds.  She also reported some dysuria and increased frequency of urination.   In ED, vitals were stable.  Orthostatic vitals positive.  Glucose 207. Cr 1.1.  Bicarb 22.  AG 16.  WBC 17 with left shift.  EtOH level negative.  EKG sinus tachycardia to 103 with occasional PVC.  Rib x-ray with minimally displaced fractures of the left ninth and sixth ribs, low lung volumes but no PTX.  CT head and CT cervical spine without acute finding.  Reportedly not able to give urine sample, and she was empirically given fosfomycin for possible UTI.  PT/OT consulted.  Subjective: Seen and examined earlier this morning.  No major events overnight of this morning.  Feels "great".  She denies dizziness with standing.  She said she had a bowel movement.  No further dysuria.  Denies chest pain, shortness of breath, palpitation, GI or UTI symptoms.  "Family" concerned about patient's safety at home given her underlying dementia and recurrent falls.   Objective: Vitals:   08/23/23 2045 08/23/23 2351 08/24/23 0414 08/24/23 0754  BP: (!) 143/98 (!) 153/101 (!) 145/84 (!) 147/83  Pulse: 97 86 92 91  Resp: 18 16 14 18   Temp: 98 F (36.7 C) 97.7 F (36.5 C) 97.8 F (36.6 C) 98.2 F (36.8 C)  TempSrc: Oral Oral Oral Oral   SpO2: 100% 100% 97% 99%  Weight:      Height:        Examination:  GENERAL: No apparent distress.  Nontoxic. HEENT: MMM.  Vision and hearing grossly intact.  NECK: Supple.  No apparent JVD.  RESP:  No IWOB.  Fair aeration bilaterally. CVS:  RRR. Heart sounds normal.  ABD/GI/GU: BS+. Abd soft, NTND.  MSK/EXT:  Moves extremities.  Small bruises and wounds on bilateral feet SKIN: Skin bruises and wounds on bilateral feet.  No signs of infection.  No drainage. NEURO: Awake and alert.  Oriented to self and family.  She knows she is at [facility] but says she is in [place] and [place].  Not oriented to time.  No apparent focal neuro deficit. PSYCH: Calm. Normal affect.   Procedures:  None  Microbiology summarized: None  Assessment and plan: Recurrent fall at home: Likely due to orthostatic hypotension.  She is also at risk for polypharmacy on tramadol, gabapentin and amitriptyline although patient's "daughter-in-love" denies taking amitriptyline at home.  She has mildly this latest left sixth and ninth rib fracture.  No pneumothorax.  CT head and cervical spine without acute finding.  She is not orthostasis this morning -Recheck orthostatic vitals -Fall precaution -PT/OT -Pain control -Minimize or avoid sedating medications  Orthostatic hypotension/syncope: Orthostatic vitals positive.  EKG with sinus tachycardia 103 with occasional PVC.  Again, polypharmacy could play a role here -  Orthostatic vitals -TED hose if she tolerates -Elevate head of bed -PT/OT -Minimize to avoid culprit meds -Consider echocardiogram if orthostatic hypotension persists  NIDDM-2: On Ozempic, glipizide and metformin at home. Recent Labs  Lab 08/23/23 1713 08/23/23 2323 08/24/23 0745  GLUCAP 99 123* 96  -Check hemoglobin A1c -Patient refuses insulin injection.  CBG within normal range for most part. -Resume home metformin -Resume home glipizide at reduced dose due to risk of  hypoglycemia  Urinary tract infection: Presents with dysuria and frequency.  Unable to obtain urine samples and she was empirically treated with fosfomycin in ED.  UTI symptoms seems to have resolved.  Leukocytosis resolving.  Essential hypertension: BP slightly elevated this morning.  She is on HCTZ at home -Resume home lisinopril -Hold HCTZ   Chronic gout: Stable -Continue home meds   Urinary frequency-continue Toviaz   Chronic pain: Stable -Tylenol 1 g every 8 hours as needed -Continue gabapentin  Mood disorder: Stable. -Resume home Celexa. -Hold amitriptyline  Dementia without behavioral disturbance: Stable.  She is awake and alert and oriented to self and place to some extent. -Reorientation and delirium precaution -Avoid or minimize sedating medications   Hyperlipidemia -continue statin   Morbid obesity: Reports about 100 pound weight loss in the last few months after starting Ozempic. Body mass index is 43.27 kg/m. -Hold Ozempic for now          DVT prophylaxis:  Place TED hose Start: 08/24/23 0731 SCDs Start: 08/23/23 1630  Code Status: Full code Family Communication: Updated "daughter-in-love" at bedside Level of care: Telemetry Medical Status is: Inpatient Remains inpatient appropriate because: Recurrent falls, UTI and orthostatic hypotension   Final disposition: TBD Consultants:  None  55 minutes with more than 50% spent in reviewing records, counseling patient/family and coordinating care.   Sch Meds:  Scheduled Meds:  allopurinol  100 mg Oral Daily   colchicine  1.2 mg Oral BID   enoxaparin (LOVENOX) injection  60 mg Subcutaneous Q24H   gabapentin  300 mg Oral TID   insulin aspart  0-24 Units Subcutaneous TID WC   pravastatin  40 mg Oral Daily   sodium chloride flush  3 mL Intravenous Once   Continuous Infusions: PRN Meds:.ondansetron **OR** ondansetron (ZOFRAN) IV  Antimicrobials: Anti-infectives (From admission, onward)    Start      Dose/Rate Route Frequency Ordered Stop   08/23/23 1700  fosfomycin (MONUROL) packet 3 g        3 g Oral  Once 08/23/23 1650 08/23/23 1806        I have personally reviewed the following labs and images: CBC: Recent Labs  Lab 08/23/23 1134 08/23/23 1144 08/23/23 1805  WBC 17.0*  --  15.3*  NEUTROABS 13.7*  --   --   HGB 13.7 14.6 14.1  HCT 41.9 43.0 42.6  MCV 90.1  --  89.7  PLT 253  --  285   BMP &GFR Recent Labs  Lab 08/23/23 1134 08/23/23 1144 08/23/23 1805  NA 138 138  --   K 3.8 3.7  --   CL 100 103  --   CO2 22  --   --   GLUCOSE 207* 200*  --   BUN 14 16  --   CREATININE 1.10* 1.10* 1.02*  CALCIUM 9.0  --   --    Estimated Creatinine Clearance: 64.3 mL/min (A) (by C-G formula based on SCr of 1.02 mg/dL (H)). Liver & Pancreas: Recent Labs  Lab 08/23/23 1134  AST 19  ALT 17  ALKPHOS 62  BILITOT 1.2  PROT 6.3*  ALBUMIN 3.7   No results for input(s): "LIPASE", "AMYLASE" in the last 168 hours. Recent Labs  Lab 08/23/23 1145  AMMONIA 19   Diabetic: No results for input(s): "HGBA1C" in the last 72 hours. Recent Labs  Lab 08/23/23 1713 08/23/23 2323 08/24/23 0745  GLUCAP 99 123* 96   Cardiac Enzymes: No results for input(s): "CKTOTAL", "CKMB", "CKMBINDEX", "TROPONINI" in the last 168 hours. No results for input(s): "PROBNP" in the last 8760 hours. Coagulation Profile: Recent Labs  Lab 08/23/23 1134  INR 1.0   Thyroid Function Tests: No results for input(s): "TSH", "T4TOTAL", "FREET4", "T3FREE", "THYROIDAB" in the last 72 hours. Lipid Profile: No results for input(s): "CHOL", "HDL", "LDLCALC", "TRIG", "CHOLHDL", "LDLDIRECT" in the last 72 hours. Anemia Panel: No results for input(s): "VITAMINB12", "FOLATE", "FERRITIN", "TIBC", "IRON", "RETICCTPCT" in the last 72 hours. Urine analysis: No results found for: "COLORURINE", "APPEARANCEUR", "LABSPEC", "PHURINE", "GLUCOSEU", "HGBUR", "BILIRUBINUR", "KETONESUR", "PROTEINUR", "UROBILINOGEN",  "NITRITE", "LEUKOCYTESUR" Sepsis Labs: Invalid input(s): "PROCALCITONIN", "LACTICIDVEN"  Microbiology: No results found for this or any previous visit (from the past 240 hour(s)).  Radiology Studies: DG Ribs Unilateral W/Chest Left  Result Date: 08/23/2023 CLINICAL DATA:  77 year old female with history of trauma from a fall complaining of left-sided rib pain. EXAM: LEFT RIBS AND CHEST - 3+ VIEW COMPARISON:  No priors. FINDINGS: Minimally displaced fractures of the lateral left ninth rib and anterior left sixth rib are noted. Lung volumes are low. No consolidative airspace disease. No pleural effusions. No pneumothorax. No pulmonary nodule or mass noted. Pulmonary vasculature and the cardiomediastinal silhouette are within normal limits. Atherosclerosis in the thoracic aorta. Orthopedic fixation hardware in the left proximal humerus partially imaged with advanced degenerative changes in the left glenohumeral joint likely related to remote trauma. IMPRESSION: 1. Minimally displaced fractures of the left ninth and sixth ribs, as above. 2. Low lung volumes.  No pneumothorax or other acute findings. 3. Aortic atherosclerosis. Electronically Signed   By: Trudie Reed M.D.   On: 08/23/2023 13:07   CT HEAD WO CONTRAST  Result Date: 08/23/2023 CLINICAL DATA:  Acute stroke suspected.  Neck trauma EXAM: CT HEAD WITHOUT CONTRAST CT CERVICAL SPINE WITHOUT CONTRAST TECHNIQUE: Multidetector CT imaging of the head and cervical spine was performed following the standard protocol without intravenous contrast. Multiplanar CT image reconstructions of the cervical spine were also generated. RADIATION DOSE REDUCTION: This exam was performed according to the departmental dose-optimization program which includes automated exposure control, adjustment of the mA and/or kV according to patient size and/or use of iterative reconstruction technique. COMPARISON:  None Available. FINDINGS: CT HEAD FINDINGS Brain: No evidence  of acute infarction, hemorrhage, hydrocephalus, extra-axial collection or mass lesion/mass effect. Chronic small vessel ischemic gliosis in the cerebral white matter. Mild cerebral volume loss for age. Vascular: No hyperdense vessel or unexpected calcification. Skull: Normal. Negative for fracture or focal lesion. Sinuses/Orbits: No acute finding. CT CERVICAL SPINE FINDINGS Alignment: No traumatic malalignment. Mild degenerative anterolisthesis at C3-4 to C5-6. Skull base and vertebrae: No acute fracture. No primary bone lesion or focal pathologic process. Soft tissues and spinal canal: No prevertebral fluid or swelling. No visible canal hematoma. Disc levels:  Generalized bulky degenerative facet spurring. Upper chest: No evidence of injury IMPRESSION: No evidence of acute intracranial or cervical spine injury. No acute intracranial finding. Electronically Signed   By: Tiburcio Pea M.D.   On: 08/23/2023 12:46   CT Cervical Spine Wo Contrast  Result  Date: 08/23/2023 CLINICAL DATA:  Acute stroke suspected.  Neck trauma EXAM: CT HEAD WITHOUT CONTRAST CT CERVICAL SPINE WITHOUT CONTRAST TECHNIQUE: Multidetector CT imaging of the head and cervical spine was performed following the standard protocol without intravenous contrast. Multiplanar CT image reconstructions of the cervical spine were also generated. RADIATION DOSE REDUCTION: This exam was performed according to the departmental dose-optimization program which includes automated exposure control, adjustment of the mA and/or kV according to patient size and/or use of iterative reconstruction technique. COMPARISON:  None Available. FINDINGS: CT HEAD FINDINGS Brain: No evidence of acute infarction, hemorrhage, hydrocephalus, extra-axial collection or mass lesion/mass effect. Chronic small vessel ischemic gliosis in the cerebral white matter. Mild cerebral volume loss for age. Vascular: No hyperdense vessel or unexpected calcification. Skull: Normal. Negative  for fracture or focal lesion. Sinuses/Orbits: No acute finding. CT CERVICAL SPINE FINDINGS Alignment: No traumatic malalignment. Mild degenerative anterolisthesis at C3-4 to C5-6. Skull base and vertebrae: No acute fracture. No primary bone lesion or focal pathologic process. Soft tissues and spinal canal: No prevertebral fluid or swelling. No visible canal hematoma. Disc levels:  Generalized bulky degenerative facet spurring. Upper chest: No evidence of injury IMPRESSION: No evidence of acute intracranial or cervical spine injury. No acute intracranial finding. Electronically Signed   By: Tiburcio Pea M.D.   On: 08/23/2023 12:46      Mercie Balsley T. Amerah Puleo [facility]  If 7PM-7AM, please contact night-coverage [email or web address] 08/24/2023, 10:01 AM

## 2023-08-24 NOTE — Evaluation (Signed)
Physical Therapy Evaluation Patient Details Name: Brandi [name] MRN: [id number] DOB: 1945/12/02 Today's Date: 08/24/2023  History of Present Illness  Pt is 77 yo female who presents on 08/23/23 with 5 falls is 32 hours as well as slurred speech and dizziness. Xray showed L 6 and 9 rib fxs. Orthostatics +.  PMH: HTN, dementia, DM  Clinical Impression  Pt admitted with above diagnosis. Pt from home alone with her cats, daughter lives 10 mins away. Pt reports she has a Life[employer] and still drives short distances. Pt hypotensive from sit to stand without symptoms, BP came up by 3 mins. Pt ambulated 200' with RW and CGA, no LOB. Recommend HHPT for balance program upon return home.  Pt currently with functional limitations due to the deficits listed below (see PT Problem List). Pt will benefit from acute skilled PT to increase their independence and safety with mobility to allow discharge.   BP supine 107/71 Sitting 110/79 Standing 0 mins 101/59 (asymptomatic) Standing 3 mins 121/63        If plan is discharge home, recommend the following: A little help with walking and/or transfers;A little help with bathing/dressing/bathroom;Assistance with cooking/housework;Assist for transportation;Supervision due to cognitive status;Direct supervision/assist for medications management;Direct supervision/assist for financial management   Can travel by private vehicle        Equipment Recommendations None recommended by PT  Recommendations for Other Services  OT consult    Functional Status Assessment Patient has had a recent decline in their functional status and demonstrates the ability to make significant improvements in function in a reasonable and predictable amount of time.     Precautions / Restrictions Precautions Precautions: Fall Precaution Comments: had multiple falls PTA Restrictions Weight Bearing Restrictions: No      Mobility  Bed Mobility               General bed mobility  comments: pt received in recliner    Transfers Overall transfer level: Needs assistance Equipment used: Rolling walker (2 wheels) Transfers: Sit to/from Stand Sit to Stand: Contact guard assist           General transfer comment: CGA for safety, vc's for hand placement    Ambulation/Gait Ambulation/Gait assistance: Contact guard assist Gait Distance (Feet): 200 Feet Assistive device: Rolling walker (2 wheels) Gait Pattern/deviations: Step-through pattern Gait velocity: decreased Gait velocity interpretation: 1.31 - 2.62 ft/sec, indicative of limited community ambulator   General Gait Details: pt steady with use of RW, no dizziness noted. Pt reports her gait speed is faster with RW than without AD  Stairs            Wheelchair Mobility     Tilt Bed    Modified Rankin (Stroke Patients Only)       Balance Overall balance assessment: Needs assistance, History of Falls Sitting-balance support: No upper extremity supported, Feet supported Sitting balance-Leahy Scale: Fair     Standing balance support: Bilateral upper extremity supported, During functional activity, Reliant on assistive device for balance Standing balance-Leahy Scale: Poor Standing balance comment: unsteady without UE support                             Pertinent Vitals/Pain Pain Assessment Pain Assessment: 0-10 Pain Score: 5  Pain Location: L ribs Pain Descriptors / Indicators: Aching Pain Intervention(s): Limited activity within patient's tolerance, Repositioned    Home Living Family/patient expects to be discharged to:: Private residence Living Arrangements: Alone Available Help at  Discharge: Family;Available PRN/intermittently Type of Home: House Home Access: Ramped entrance       Home Layout: One level Home Equipment: Rollator (4 wheels);Cane - single point;BSC/3in1;Shower seat (walking sticks, Life Alert)      Prior Function Prior Level of Function :  Independent/Modified Independent;Driving             Mobility Comments: Has a rollator she uses in house and one with bigger wheels she uses outside. Does not think she had rollator with her any time she fell. Slid off the bed at least one time       Extremity/Trunk Assessment   Upper Extremity Assessment Upper Extremity Assessment: Overall WFL for tasks assessed    Lower Extremity Assessment Lower Extremity Assessment: Generalized weakness    Cervical / Trunk Assessment Cervical / Trunk Assessment: Normal  Communication   Communication Communication: No apparent difficulties Cueing Techniques: Verbal cues  Cognition Arousal: Alert Behavior During Therapy: WFL for tasks assessed/performed Overall Cognitive Status: History of cognitive impairments - at baseline                                 General Comments: STM deficits, esp with recent events.        General Comments General comments (skin integrity, edema, etc.): BP supine 107/71, sitting 110/79, standing 0 mins 101/59 (asymptomatic), standing 3 mins 121/63. Pt reports discomfort with inspiration, gave her an IS and goal set at 1000 for feedback with breathing. Educated pt on premobility exercises to manage hypotension    Exercises     Assessment/Plan    PT Assessment Patient needs continued PT services  PT Problem List Decreased mobility;Decreased balance;Decreased activity tolerance;Pain;Cardiopulmonary status limiting activity;Decreased strength       PT Treatment Interventions DME instruction;Gait training;Functional mobility training;Therapeutic activities;Therapeutic exercise;Balance training;Neuromuscular re-education;Cognitive remediation;Patient/family education    PT Goals (Current goals can be found in the Care Plan section)  Acute Rehab PT Goals Patient Stated Goal: return home to her cats PT Goal Formulation: With patient Time For Goal Achievement: 09/07/23 Potential to Achieve  Goals: Good    Frequency Min 1X/week     Co-evaluation               AM-PAC PT "6 Clicks" Mobility  Outcome Measure Help needed turning from your back to your side while in a flat bed without using bedrails?: A Little Help needed moving from lying on your back to sitting on the side of a flat bed without using bedrails?: A Little Help needed moving to and from a bed to a chair (including a wheelchair)?: A Little Help needed standing up from a chair using your arms (e.g., wheelchair or bedside chair)?: A Little Help needed to walk in hospital room?: A Little Help needed climbing 3-5 steps with a railing? : A Little 6 Click Score: 18    End of Session Equipment Utilized During Treatment: Gait belt Activity Tolerance: Patient tolerated treatment well Patient left: in chair;with call bell/phone within reach Nurse Communication: Mobility status PT Visit Diagnosis: Unsteadiness on feet (R26.81);Repeated falls (R29.6);Dizziness and giddiness (R42);Pain Pain - Right/Left: Left Pain - part of body:  (ribs)    Time: 4098-1191 PT Time Calculation (min) (ACUTE ONLY): 37 min   Charges:   PT Evaluation $PT Eval Moderate Complexity: 1 Mod PT Treatments $Gait Training: 8-22 mins PT General Charges $$ ACUTE PT VISIT: 1 Visit  Lyanne Co, PT  Acute Rehab Services Secure chat preferred Office [phone]   Lawana Chambers Christofer Shen 08/24/2023, 2:15 PM

## 2023-08-25 DIAGNOSIS — R296 Repeated falls: Secondary | ICD-10-CM | POA: Diagnosis not present

## 2023-08-25 LAB — RENAL FUNCTION PANEL
Albumin: 3.8 g/dL (ref 3.5–5.0)
Anion gap: 10 (ref 5–15)
BUN: 9 mg/dL (ref 8–23)
CO2: 27 mmol/L (ref 22–32)
Calcium: 9 mg/dL (ref 8.9–10.3)
Chloride: 101 mmol/L (ref 98–111)
Creatinine, Ser: 0.77 mg/dL (ref 0.44–1.00)
GFR, Estimated: >60 mL/min (ref >60)
Glucose, Bld: 135 mg/dL — ABNORMAL HIGH (ref 70–99)
Phosphorus: 3.7 mg/dL (ref 2.5–4.6)
Potassium: 3.8 mmol/L (ref 3.5–5.1)
Sodium: 138 mmol/L (ref 135–145)

## 2023-08-25 LAB — CBC
HCT: 42.6 % (ref 36.0–46.0)
Hemoglobin: 14.1 g/dL (ref 12.0–15.0)
MCH: 29.7 pg (ref 26.0–34.0)
MCHC: 33.1 g/dL (ref 30.0–36.0)
MCV: 89.7 fL (ref 80.0–100.0)
Platelets: 255 10*3/uL (ref 150–400)
RBC: 4.75 MIL/uL (ref 3.87–5.11)
RDW: 13.6 % (ref 11.5–15.5)
WBC: 11 10*3/uL — ABNORMAL HIGH (ref 4.0–10.5)
nRBC: 0 % (ref 0.0–0.2)

## 2023-08-25 LAB — MAGNESIUM: Magnesium: 1.7 mg/dL (ref 1.7–2.4)

## 2023-08-25 MED ORDER — AMITRIPTYLINE HCL 100 MG PO TABS: 50.0000 mg | ORAL_TABLET | Freq: Every day | ORAL | Status: AC

## 2023-08-25 MED ORDER — ENSURE ENLIVE PO LIQD
237.0000 mL | Freq: Two times a day (BID) | ORAL | Status: DC
Start: 2023-08-25 — End: 2023-08-25

## 2023-08-25 MED ORDER — GABAPENTIN 300 MG PO CAPS: 300.0000 mg | ORAL_CAPSULE | Freq: Two times a day (BID) | ORAL | Status: AC

## 2023-08-25 NOTE — Plan of Care (Signed)
UA sent earlier in the shift. No issues noted. Pt ambulated to the bathroom X2 for urination. No c/o dizziness or lightheadedness.   Problem: Education: Goal: Knowledge of General Education information will improve Description: Including pain rating scale, medication(s)/side effects and non-pharmacologic comfort measures Outcome: Progressing   Problem: Health Behavior/Discharge Planning: Goal: Ability to manage health-related needs will improve Outcome: Progressing   Problem: Clinical Measurements: Goal: Ability to maintain clinical measurements within normal limits will improve Outcome: Progressing Goal: Will remain free from infection Outcome: Progressing   Problem: Elimination: Goal: Will not experience complications related to bowel motility Outcome: Progressing   Problem: Pain Managment: Goal: General experience of comfort will improve Outcome: Progressing   Problem: Safety: Goal: Ability to remain free from injury will improve Outcome: Progressing

## 2023-08-25 NOTE — Evaluation (Signed)
Occupational Therapy Evaluation Patient Details Name: Brandi [name] MRN: [id number] DOB: October 28, 1946 Today's Date: 08/25/2023   History of Present Illness Pt is 77 yo female who presents on 08/23/23 with 5 falls is 32 hours as well as slurred speech and dizziness. Xray showed L 6 and 9 rib fxs. Orthostatics +.  PMH: HTN, dementia, DM   Clinical Impression   PTA patient independent with ADLs, light IADLs and using rollator for mobility. Admitted for above and presents with problem list below.  She has hx of dementia, scoring 6/28 (questionable deficits) on short blessed test, but does demonstrate ability to recall fall prevention recommendations and safety for home.  She is able to complete transfers and mobility using RW, ADLs with supervision given increased time. She will have intermittent support from daughter at home.  Based on performance today, recommend continued OT services acutely and after dc at home health OT level to optimize safety and independence with ADLs.       If plan is discharge home, recommend the following: A little help with walking and/or transfers;A little help with bathing/dressing/bathroom;Assistance with cooking/housework;Direct supervision/assist for medications management;Direct supervision/assist for financial management;Assist for transportation    Functional Status Assessment  Patient has had a recent decline in their functional status and demonstrates the ability to make significant improvements in function in a reasonable and predictable amount of time.  Equipment Recommendations  None recommended by OT    Recommendations for Other Services       Precautions / Restrictions Precautions Precautions: Fall Precaution Comments: had multiple falls PTA Restrictions Weight Bearing Restrictions: No      Mobility Bed Mobility               General bed mobility comments: in recliner    Transfers Overall transfer level: Needs assistance Equipment  used: Rolling walker (2 wheels) Transfers: Sit to/from Stand Sit to Stand: Supervision           General transfer comment: for safety      Balance Overall balance assessment: Needs assistance, History of Falls Sitting-balance support: No upper extremity supported, Feet supported Sitting balance-Leahy Scale: Fair     Standing balance support: Bilateral upper extremity supported, During functional activity, Reliant on assistive device for balance Standing balance-Leahy Scale: Poor Standing balance comment: relies on BUE support dynamically, able to engage in ADLs without UE support and supervision                           ADL either performed or assessed with clinical judgement   ADL Overall ADL's : Needs assistance/impaired     Grooming: Supervision/safety;Standing;Wash/dry hands           Upper Body Dressing : Set up;Sitting   Lower Body Dressing: Supervision/safety;Sit to/from stand Lower Body Dressing Details (indicate cue type and reason): increased effort to don/doff socks but no assist required Toilet Transfer: Ambulation;Supervision/safety;Rolling walker (2 wheels)           Functional mobility during ADLs: Supervision/safety;Rolling walker (2 wheels) General ADL Comments: reviewed fall prevention techniques and safety     Vision Baseline Vision/History: 0 No visual deficits Patient Visual Report: No change from baseline Vision Assessment?: No apparent visual deficits     Perception         Praxis         Pertinent Vitals/Pain Pain Assessment Pain Assessment: 0-10 Pain Score: 0-No pain Pain Intervention(s): Limited activity within patient's tolerance, Monitored during session, Repositioned  Extremity/Trunk Assessment Upper Extremity Assessment Upper Extremity Assessment: Right hand dominant;LUE deficits/detail;Generalized weakness LUE Deficits / Details: limited shoulder ROM to 80* with pt report prior humerus surgery LUE  Coordination: decreased gross motor   Lower Extremity Assessment Lower Extremity Assessment: Defer to PT evaluation   Cervical / Trunk Assessment Cervical / Trunk Assessment: Normal   Communication Communication Communication: No apparent difficulties Cueing Techniques: Verbal cues   Cognition Arousal: Alert Behavior During Therapy: WFL for tasks assessed/performed Overall Cognitive Status: History of cognitive impairments - at baseline                                 General Comments: short blessed test scoring 6/28 (questionable) but known hx of dementia.  She follows commands and can recall education of fall prevention recommendations.     General Comments  discussed fall prevention techniques and recommendations- pt able to recall and voiced understanding    Exercises     Shoulder Instructions      Home Living Family/patient expects to be discharged to:: Private residence Living Arrangements: Alone Available Help at Discharge: Family;Available PRN/intermittently Type of Home: House Home Access: Ramped entrance     Home Layout: One level     Bathroom Shower/Tub: Producer, television/film/video: Handicapped height Bathroom Accessibility: Yes How Accessible: Accessible via walker Home Equipment: Rollator (4 wheels);Cane - single point;BSC/3in1;Shower seat;Grab bars - tub/shower;Grab bars - toilet (walking stick, life alert)          Prior Functioning/Environment Prior Level of Function : Independent/Modified Independent;Driving;History of Falls (last six months)             Mobility Comments: Has a rollator she uses in house and one with bigger wheels she uses outside. Does not think she had rollator with her any time she fell. Slid off the bed at least one time ADLs Comments: independent ADLs, light IADLs, driving, manages meds/fiances        OT Problem List: Decreased strength;Decreased activity tolerance;Impaired balance (sitting and/or  standing);Obesity;Decreased knowledge of precautions;Decreased knowledge of use of DME or AE;Decreased cognition;Decreased safety awareness      OT Treatment/Interventions: Self-care/ADL training;Therapeutic exercise;DME and/or AE instruction;Therapeutic activities;Balance training;Patient/family education;Cognitive remediation/compensation    OT Goals(Current goals can be found in the care plan section) Acute Rehab OT Goals Patient Stated Goal: home today OT Goal Formulation: With patient Time For Goal Achievement: 09/08/23 Potential to Achieve Goals: Good  OT Frequency: Min 1X/week    Co-evaluation              AM-PAC OT "6 Clicks" Daily Activity     Outcome Measure Help from another person eating meals?: None Help from another person taking care of personal grooming?: A Little Help from another person toileting, which includes using toliet, bedpan, or urinal?: A Little Help from another person bathing (including washing, rinsing, drying)?: A Little Help from another person to put on and taking off regular upper body clothing?: A Little Help from another person to put on and taking off regular lower body clothing?: A Little 6 Click Score: 19   End of Session Equipment Utilized During Treatment: Rolling walker (2 wheels) Nurse Communication: Mobility status  Activity Tolerance: Patient tolerated treatment well Patient left: in chair;with call bell/phone within reach  OT Visit Diagnosis: Other abnormalities of gait and mobility (R26.89);Muscle weakness (generalized) (M62.81);History of falling (Z91.81)  Time: 4098-1191 OT Time Calculation (min): 19 min Charges:  OT General Charges $OT Visit: 1 Visit OT Evaluation $OT Eval Moderate Complexity: 1 Mod  Barry Brunner, OT Acute Rehabilitation Services Office [phone]   Chancy Milroy 08/25/2023, 10:50 AM

## 2023-08-25 NOTE — TOC CAGE-AID Note (Signed)
Transition of Care [facility]) - CAGE-AID Screening   Patient Details  Name: Brandi [name] MRN: [id number] Date of Birth: April 30, 1946  Transition of Care [facility]) CM/SW Contact:    Barrie Folk, RN Phone Number: 08/25/2023, 10:55 AM   Clinical Narrative: Pt admitted for frequent falls. Pt denies concerns with drug or alcohol abuse.    CAGE-AID Screening:    Have You Ever Felt You Ought to Cut Down on Your Drinking or Drug Use?: No Have People Annoyed You By Critizing Your Drinking Or Drug Use?: No Have You Felt Bad Or Guilty About Your Drinking Or Drug Use?: No Have You Ever Had a Drink or Used Drugs First Thing In The Morning to Steady Your Nerves or to Get Rid of a Hangover?: No CAGE-AID Score: 0

## 2023-08-25 NOTE — Discharge Summary (Signed)
Brandi [name] GMW:[id number] DOB: June 28, 1946 DOA: 08/23/2023  PCP: Ardath Sax, FNP  Admit date: 08/23/2023 Discharge date: 08/25/2023  Time spent: 35 minutes  Recommendations for Outpatient Follow-up:  Pcp f/u  Consider med changes to reduce the risk of orthostatic hypotension    Discharge Diagnoses:  Principal Problem:   Falls frequently Active Problems:   Diabetes (HCC)   Obesity   UTI (urinary tract infection)   Diabetic neuropathy (HCC)   Essential hypertension   Orthostatic hypotension   Dementia without behavioral disturbance (HCC)   Discharge Condition: stable  Diet recommendation: heart healthy  Filed Weights   08/23/23 1127  Weight: 121.6 kg    History of present illness:  From admission h and p Brandi [name] is a 77 y.o. female with medical history significant of dementia, depression, hypertension, hyperlipidemia, breast cancer presents emergency department with frequent falls.  Over the last 3 days she is fallen at least 5 times.  She states anytime she stands up or moves her head she feels dizzy and falls down.  She denies tripping on any items.  The last time she fell she developed right-sided rib pain and pain with breathing so she presented to the ER for further assessment.  Family was concerned about her wellbeing at home.  She was reportedly endorsing dysuria and frequency.  On arrival to the ER she was afebrile and hemodynamically stable.  Labs were obtained which revealed INR 1.0, WBC 17, hemoglobin 13.7, creatinine 1.1, chest x-ray demonstrated evidence of left-sided rib fractures.  CT head showed no acute intracranial abnormalities.  CT spine showed no evidence of spine fracture.  Patient was admitted for further workup.  On admission she was resting comfortably.  She states she has had poor p.o. intake since starting Ozempic and endorses dehydration.  She denies any suprapubic tenderness however has had urinary frequency.  She denies any fever or  chills.   Hospital Course:  Patient presents with fall at home and left sided 6-9 rib fracture. No pneumothorax or other complication of rib fracture, pain controlled and no signs/symptoms of pneumonia. Evaluated by PT and found to have orthostatic hypotension though asymptomatic at time of their eval. Several home medications likely contribute - advise decreasing dose of amitryptyline and gabapentin to start, consider transitioning off those meds completely. Home citalopram may also need ot be adjusted as may home bp meds though here patient hypertensive with holding those meds would advise focusing on non-bp meds first. Also takes tramadol at home though she says it's very infrequent, that obviously could also contribute particularly if timing of falls is associated with its use. Evaluated by PT and deemed safe for d/c home, they did advise home health PT but patient declines.   Procedures: none   Consultations: none  Discharge Exam: Vitals:   08/25/23 0410 08/25/23 0739  BP: (!) 166/86 (!) 186/96  Pulse: 86 90  Resp: 16 17  Temp: 98.2 F (36.8 C) 97.6 F (36.4 C)  SpO2: 94% 99%    General: NAD Cardiovascular: RRR Respiratory: CTAB Ext: warm   Discharge Instructions   Discharge Instructions     Diet - low sodium heart healthy   Complete by: As directed    Increase activity slowly   Complete by: As directed       Allergies as of 08/25/2023   No Known Allergies      Medication List     STOP taking these medications    oxybutynin 10 MG 24 hr tablet Commonly  known as: DITROPAN-XL       TAKE these medications    allopurinol 100 MG tablet Commonly known as: ZYLOPRIM Take 1 tablet by mouth daily as needed (flair up of gout).   amitriptyline 100 MG tablet Commonly known as: ELAVIL Take 0.5 tablets (50 mg total) by mouth at bedtime. What changed: how much to take   azelastine 0.1 % nasal spray Commonly known as: ASTELIN Place 1 spray into both nostrils  daily.   cephALEXin 250 MG capsule Commonly known as: KEFLEX Take 1 tablet by mouth daily.   citalopram 20 MG tablet Commonly known as: CELEXA Take 20 mg by mouth daily.   fluticasone 50 MCG/ACT nasal spray Commonly known as: FLONASE Place 1 spray into both nostrils daily as needed for allergies.   FreeStyle Libre 3 Sensor Misc daily.   gabapentin 300 MG capsule Commonly known as: NEURONTIN Take 1 capsule (300 mg total) by mouth 2 (two) times daily. Take 1 tablet by mouth in the morning and then 2 tablets by mouth at night What changed: when to take this   glipiZIDE 10 MG tablet Commonly known as: GLUCOTROL Take 15 mg by mouth daily.   hydrochlorothiazide 25 MG tablet Commonly known as: HYDRODIURIL Take 25 mg by mouth daily.   lisinopril 40 MG tablet Commonly known as: ZESTRIL Take 40 mg by mouth daily.   metFORMIN 1000 MG tablet Commonly known as: GLUCOPHAGE Take 1,000 mg by mouth 2 (two) times daily with a meal.   montelukast 10 MG tablet Commonly known as: SINGULAIR Take 10 mg by mouth at bedtime.   moxifloxacin 0.5 % ophthalmic solution Commonly known as: VIGAMOX Place 1 drop into both eyes 3 (three) times daily as needed.   nystatin powder Apply 1 Application topically See admin instructions. Apply to affected areas under the breast one to two times daily as needed.   Ozempic (0.25 or 0.5 MG/DOSE) 2 MG/3ML Sopn Generic drug: Semaglutide(0.25 or 0.5MG /DOS) Inject 0.5 mg into the skin once a week.   pravastatin 40 MG tablet Commonly known as: PRAVACHOL Take 40 mg by mouth daily.   traMADol 50 MG tablet Commonly known as: ULTRAM Take 50-100 mg by mouth in the morning, at noon, and at bedtime.   UNABLE TO FIND Place 10-15 cm into the nose. Horizon Nasal CPAP System: auto 10 - 15 cm H2O w/humidification   UNABLE TO FIND Apply 1 Application topically in the morning and at bedtime. Greer's Goo       No Known Allergies  Follow-up Information      Ardath Sax, FNP Follow up.   Specialty: Nurse Practitioner Contact information: [address] [place] [address] [phone]                  The results of significant diagnostics from this hospitalization (including imaging, microbiology, ancillary and laboratory) are listed below for reference.    Significant Diagnostic Studies: DG Ribs Unilateral W/Chest Left  Result Date: 08/23/2023 CLINICAL DATA:  77 year old female with history of trauma from a fall complaining of left-sided rib pain. EXAM: LEFT RIBS AND CHEST - 3+ VIEW COMPARISON:  No priors. FINDINGS: Minimally displaced fractures of the lateral left ninth rib and anterior left sixth rib are noted. Lung volumes are low. No consolidative airspace disease. No pleural effusions. No pneumothorax. No pulmonary nodule or mass noted. Pulmonary vasculature and the cardiomediastinal silhouette are within normal limits. Atherosclerosis in the thoracic aorta. Orthopedic fixation hardware in the left proximal  humerus partially imaged with advanced degenerative changes in the left glenohumeral joint likely related to remote trauma. IMPRESSION: 1. Minimally displaced fractures of the left ninth and sixth ribs, as above. 2. Low lung volumes.  No pneumothorax or other acute findings. 3. Aortic atherosclerosis. Electronically Signed   By: Trudie Reed M.D.   On: 08/23/2023 13:07   CT HEAD WO CONTRAST  Result Date: 08/23/2023 CLINICAL DATA:  Acute stroke suspected.  Neck trauma EXAM: CT HEAD WITHOUT CONTRAST CT CERVICAL SPINE WITHOUT CONTRAST TECHNIQUE: Multidetector CT imaging of the head and cervical spine was performed following the standard protocol without intravenous contrast. Multiplanar CT image reconstructions of the cervical spine were also generated. RADIATION DOSE REDUCTION: This exam was performed according to the departmental dose-optimization program which includes automated exposure control, adjustment of the mA  and/or kV according to patient size and/or use of iterative reconstruction technique. COMPARISON:  None Available. FINDINGS: CT HEAD FINDINGS Brain: No evidence of acute infarction, hemorrhage, hydrocephalus, extra-axial collection or mass lesion/mass effect. Chronic small vessel ischemic gliosis in the cerebral white matter. Mild cerebral volume loss for age. Vascular: No hyperdense vessel or unexpected calcification. Skull: Normal. Negative for fracture or focal lesion. Sinuses/Orbits: No acute finding. CT CERVICAL SPINE FINDINGS Alignment: No traumatic malalignment. Mild degenerative anterolisthesis at C3-4 to C5-6. Skull base and vertebrae: No acute fracture. No primary bone lesion or focal pathologic process. Soft tissues and spinal canal: No prevertebral fluid or swelling. No visible canal hematoma. Disc levels:  Generalized bulky degenerative facet spurring. Upper chest: No evidence of injury IMPRESSION: No evidence of acute intracranial or cervical spine injury. No acute intracranial finding. Electronically Signed   By: Tiburcio Pea M.D.   On: 08/23/2023 12:46   CT Cervical Spine Wo Contrast  Result Date: 08/23/2023 CLINICAL DATA:  Acute stroke suspected.  Neck trauma EXAM: CT HEAD WITHOUT CONTRAST CT CERVICAL SPINE WITHOUT CONTRAST TECHNIQUE: Multidetector CT imaging of the head and cervical spine was performed following the standard protocol without intravenous contrast. Multiplanar CT image reconstructions of the cervical spine were also generated. RADIATION DOSE REDUCTION: This exam was performed according to the departmental dose-optimization program which includes automated exposure control, adjustment of the mA and/or kV according to patient size and/or use of iterative reconstruction technique. COMPARISON:  None Available. FINDINGS: CT HEAD FINDINGS Brain: No evidence of acute infarction, hemorrhage, hydrocephalus, extra-axial collection or mass lesion/mass effect. Chronic small vessel  ischemic gliosis in the cerebral white matter. Mild cerebral volume loss for age. Vascular: No hyperdense vessel or unexpected calcification. Skull: Normal. Negative for fracture or focal lesion. Sinuses/Orbits: No acute finding. CT CERVICAL SPINE FINDINGS Alignment: No traumatic malalignment. Mild degenerative anterolisthesis at C3-4 to C5-6. Skull base and vertebrae: No acute fracture. No primary bone lesion or focal pathologic process. Soft tissues and spinal canal: No prevertebral fluid or swelling. No visible canal hematoma. Disc levels:  Generalized bulky degenerative facet spurring. Upper chest: No evidence of injury IMPRESSION: No evidence of acute intracranial or cervical spine injury. No acute intracranial finding. Electronically Signed   By: Tiburcio Pea M.D.   On: 08/23/2023 12:46    Microbiology: No results found for this or any previous visit (from the past 240 hour(s)).   Labs: Basic Metabolic Panel: Recent Labs  Lab 08/23/23 1134 08/23/23 1144 08/23/23 1805 08/24/23 0915  NA 138 138  --  140  K 3.8 3.7  --  3.8  CL 100 103  --  102  CO2 22  --   --  26  GLUCOSE 207* 200*  --  154*  BUN 14 16  --  10  CREATININE 1.10* 1.10* 1.02* 0.91  CALCIUM 9.0  --   --  8.8*  MG  --   --   --  1.2*  PHOS  --   --   --  4.1   Liver Function Tests: Recent Labs  Lab 08/23/23 1134 08/24/23 0915  AST 19  --   ALT 17  --   ALKPHOS 62  --   BILITOT 1.2  --   PROT 6.3*  --   ALBUMIN 3.7 3.6   No results for input(s): "LIPASE", "AMYLASE" in the last 168 hours. Recent Labs  Lab 08/23/23 1145  AMMONIA 19   CBC: Recent Labs  Lab 08/23/23 1134 08/23/23 1144 08/23/23 1805 08/24/23 0915  WBC 17.0*  --  15.3* 11.5*  NEUTROABS 13.7*  --   --   --   HGB 13.7 14.6 14.1 13.6  HCT 41.9 43.0 42.6 41.4  MCV 90.1  --  89.7 89.4  PLT 253  --  285 246   Cardiac Enzymes: No results for input(s): "CKTOTAL", "CKMB", "CKMBINDEX", "TROPONINI" in the last 168 hours. BNP: BNP (last 3  results) No results for input(s): "BNP" in the last 8760 hours.  ProBNP (last 3 results) No results for input(s): "PROBNP" in the last 8760 hours.  CBG: Recent Labs  Lab 08/23/23 1713 08/23/23 2323 08/24/23 0745 08/24/23 1115  GLUCAP 99 123* 96 173*       Signed:  Silvano Bilis MD.  [facility] 08/25/2023, 8:34 AM ]
